# Patient Record
Sex: Female | Born: 1975 | Race: Black or African American | Hispanic: No | Marital: Married | Smoking: Never smoker
Health system: Southern US, Community
[De-identification: ages and names within clinical notes are randomized; demographics above are authoritative.]

## PROBLEM LIST (undated history)

## (undated) DIAGNOSIS — F419 Anxiety disorder, unspecified: Secondary | ICD-10-CM

## (undated) DIAGNOSIS — R42 Dizziness and giddiness: Secondary | ICD-10-CM

## (undated) DIAGNOSIS — M419 Scoliosis, unspecified: Secondary | ICD-10-CM

## (undated) DIAGNOSIS — F32A Depression, unspecified: Secondary | ICD-10-CM

## (undated) DIAGNOSIS — D649 Anemia, unspecified: Secondary | ICD-10-CM

## (undated) HISTORY — PX: TUBAL LIGATION: SHX77

## (undated) HISTORY — PX: DIAGNOSTIC LAPAROSCOPY: SUR761

---

## 2004-04-03 ENCOUNTER — Emergency Department (HOSPITAL_COMMUNITY): Admission: EM | Admit: 2004-04-03 | Discharge: 2004-04-03 | Payer: Self-pay | Admitting: Emergency Medicine

## 2004-05-26 ENCOUNTER — Emergency Department (HOSPITAL_COMMUNITY): Admission: EM | Admit: 2004-05-26 | Discharge: 2004-05-26 | Payer: Self-pay | Admitting: Emergency Medicine

## 2004-07-03 ENCOUNTER — Ambulatory Visit: Payer: Self-pay | Admitting: Internal Medicine

## 2004-07-03 ENCOUNTER — Ambulatory Visit: Payer: Self-pay | Admitting: *Deleted

## 2004-07-04 ENCOUNTER — Ambulatory Visit: Payer: Self-pay | Admitting: Internal Medicine

## 2004-08-31 ENCOUNTER — Ambulatory Visit: Payer: Self-pay | Admitting: Internal Medicine

## 2004-09-25 ENCOUNTER — Ambulatory Visit: Payer: Self-pay | Admitting: Family Medicine

## 2004-10-04 ENCOUNTER — Ambulatory Visit: Payer: Self-pay | Admitting: Internal Medicine

## 2004-11-21 ENCOUNTER — Ambulatory Visit: Payer: Self-pay | Admitting: Internal Medicine

## 2004-12-18 ENCOUNTER — Ambulatory Visit: Payer: Self-pay | Admitting: Family Medicine

## 2005-01-17 ENCOUNTER — Emergency Department (HOSPITAL_COMMUNITY): Admission: EM | Admit: 2005-01-17 | Discharge: 2005-01-17 | Payer: Self-pay | Admitting: Emergency Medicine

## 2005-03-12 ENCOUNTER — Ambulatory Visit: Payer: Self-pay | Admitting: Internal Medicine

## 2005-06-04 ENCOUNTER — Ambulatory Visit: Payer: Self-pay | Admitting: Internal Medicine

## 2005-06-05 ENCOUNTER — Ambulatory Visit: Payer: Self-pay | Admitting: Internal Medicine

## 2005-08-27 ENCOUNTER — Ambulatory Visit: Payer: Self-pay | Admitting: Internal Medicine

## 2005-11-19 ENCOUNTER — Ambulatory Visit: Payer: Self-pay | Admitting: Internal Medicine

## 2006-02-12 ENCOUNTER — Ambulatory Visit: Payer: Self-pay | Admitting: Internal Medicine

## 2006-03-26 ENCOUNTER — Ambulatory Visit: Payer: Self-pay | Admitting: Internal Medicine

## 2006-05-07 ENCOUNTER — Ambulatory Visit: Payer: Self-pay | Admitting: Internal Medicine

## 2006-07-29 ENCOUNTER — Ambulatory Visit: Payer: Self-pay | Admitting: Internal Medicine

## 2006-08-27 ENCOUNTER — Emergency Department (HOSPITAL_COMMUNITY): Admission: EM | Admit: 2006-08-27 | Discharge: 2006-08-27 | Payer: Self-pay | Admitting: Emergency Medicine

## 2006-10-16 ENCOUNTER — Encounter (INDEPENDENT_AMBULATORY_CARE_PROVIDER_SITE_OTHER): Payer: Self-pay | Admitting: *Deleted

## 2006-10-22 ENCOUNTER — Ambulatory Visit: Payer: Self-pay | Admitting: Family Medicine

## 2006-11-27 DIAGNOSIS — G43909 Migraine, unspecified, not intractable, without status migrainosus: Secondary | ICD-10-CM | POA: Insufficient documentation

## 2006-11-27 DIAGNOSIS — R05 Cough: Secondary | ICD-10-CM

## 2006-12-13 ENCOUNTER — Encounter (INDEPENDENT_AMBULATORY_CARE_PROVIDER_SITE_OTHER): Payer: Self-pay | Admitting: Internal Medicine

## 2006-12-30 ENCOUNTER — Emergency Department (HOSPITAL_COMMUNITY): Admission: EM | Admit: 2006-12-30 | Discharge: 2006-12-30 | Payer: Self-pay | Admitting: Emergency Medicine

## 2006-12-31 ENCOUNTER — Ambulatory Visit: Payer: Self-pay | Admitting: Internal Medicine

## 2007-01-01 ENCOUNTER — Ambulatory Visit: Payer: Self-pay | Admitting: Internal Medicine

## 2007-01-10 ENCOUNTER — Ambulatory Visit: Payer: Self-pay | Admitting: Internal Medicine

## 2007-01-13 ENCOUNTER — Ambulatory Visit: Payer: Self-pay | Admitting: Internal Medicine

## 2007-04-08 ENCOUNTER — Ambulatory Visit: Payer: Self-pay | Admitting: Internal Medicine

## 2007-06-30 ENCOUNTER — Ambulatory Visit: Payer: Self-pay | Admitting: Internal Medicine

## 2007-09-23 ENCOUNTER — Ambulatory Visit: Payer: Self-pay | Admitting: Internal Medicine

## 2007-12-17 ENCOUNTER — Ambulatory Visit: Payer: Self-pay | Admitting: Internal Medicine

## 2008-03-11 ENCOUNTER — Ambulatory Visit: Payer: Self-pay | Admitting: Internal Medicine

## 2008-06-15 ENCOUNTER — Ambulatory Visit: Payer: Self-pay | Admitting: Internal Medicine

## 2008-06-16 ENCOUNTER — Ambulatory Visit: Payer: Self-pay | Admitting: Internal Medicine

## 2008-08-19 ENCOUNTER — Emergency Department (HOSPITAL_COMMUNITY): Admission: EM | Admit: 2008-08-19 | Discharge: 2008-08-19 | Payer: Self-pay | Admitting: Emergency Medicine

## 2008-09-13 ENCOUNTER — Ambulatory Visit: Payer: Self-pay | Admitting: Internal Medicine

## 2008-10-07 ENCOUNTER — Encounter: Payer: Self-pay | Admitting: Internal Medicine

## 2008-10-07 ENCOUNTER — Ambulatory Visit: Payer: Self-pay | Admitting: Internal Medicine

## 2008-10-07 LAB — CONVERTED CEMR LAB
Chlamydia, DNA Probe: NEGATIVE
GC Probe Amp, Genital: NEGATIVE

## 2008-12-15 ENCOUNTER — Ambulatory Visit: Payer: Self-pay | Admitting: Internal Medicine

## 2008-12-15 LAB — CONVERTED CEMR LAB: Preg, Serum: NEGATIVE

## 2008-12-16 ENCOUNTER — Ambulatory Visit: Payer: Self-pay | Admitting: Internal Medicine

## 2009-03-08 ENCOUNTER — Ambulatory Visit: Payer: Self-pay | Admitting: Internal Medicine

## 2009-04-13 ENCOUNTER — Emergency Department (HOSPITAL_COMMUNITY): Admission: EM | Admit: 2009-04-13 | Discharge: 2009-04-13 | Payer: Self-pay | Admitting: Family Medicine

## 2009-06-07 ENCOUNTER — Ambulatory Visit (HOSPITAL_COMMUNITY): Admission: RE | Admit: 2009-06-07 | Discharge: 2009-06-07 | Payer: Self-pay | Admitting: Internal Medicine

## 2009-06-07 ENCOUNTER — Ambulatory Visit: Payer: Self-pay | Admitting: Internal Medicine

## 2009-06-09 ENCOUNTER — Telehealth (INDEPENDENT_AMBULATORY_CARE_PROVIDER_SITE_OTHER): Payer: Self-pay | Admitting: *Deleted

## 2009-09-14 ENCOUNTER — Ambulatory Visit: Payer: Self-pay | Admitting: Internal Medicine

## 2009-09-14 LAB — CONVERTED CEMR LAB: Preg, Serum: NEGATIVE

## 2009-09-15 ENCOUNTER — Ambulatory Visit: Payer: Self-pay | Admitting: Internal Medicine

## 2010-01-04 ENCOUNTER — Encounter (INDEPENDENT_AMBULATORY_CARE_PROVIDER_SITE_OTHER): Payer: Self-pay | Admitting: Family Medicine

## 2010-01-04 LAB — CONVERTED CEMR LAB: hCG, Beta Chain, Quant, S: <2 milliintl units/mL

## 2010-02-28 NOTE — Progress Notes (Signed)
Summary: triage/draining nodule under right breast  Phone Note Call from Patient   Caller: Patient Reason for Call: Talk to Nurse Summary of Call: patient was seen on 06/07/09 by Dr. Reche Dixon..small nodule under right breast..and placed on antibiotics...patient called back today to report area draining...consulted with Dr. Reche Dixon...apply warm compresses four times a day and continue antibiotics...patient advised to call back with worsening s/s..or no improvement in a few days. Initial call taken by: Conchita Paris,  Jun 09, 2009 12:32 PM

## 2010-04-23 LAB — BASIC METABOLIC PANEL
BUN: 6 mg/dL (ref 6–23)
CO2: 24 mEq/L (ref 19–32)
Calcium: 8.8 mg/dL (ref 8.4–10.5)
Glucose, Bld: 97 mg/dL (ref 70–99)
Sodium: 138 mEq/L (ref 135–145)

## 2010-04-23 LAB — CBC
HCT: 34.6 % — ABNORMAL LOW (ref 36.0–46.0)
Hemoglobin: 11.6 g/dL — ABNORMAL LOW (ref 12.0–15.0)
MCV: 85.2 fL (ref 78.0–100.0)

## 2010-04-23 LAB — DIFFERENTIAL
Eosinophils Relative: 3 % (ref 0–5)
Lymphs Abs: 2.4 10*3/uL (ref 0.7–4.0)
Monocytes Absolute: 0.7 10*3/uL (ref 0.1–1.0)
Neutrophils Relative %: 66 % (ref 43–77)

## 2010-05-24 ENCOUNTER — Other Ambulatory Visit (HOSPITAL_COMMUNITY): Payer: Self-pay | Admitting: Family Medicine

## 2010-05-24 DIAGNOSIS — R102 Pelvic and perineal pain: Secondary | ICD-10-CM

## 2010-05-29 ENCOUNTER — Ambulatory Visit (HOSPITAL_COMMUNITY)
Admission: RE | Admit: 2010-05-29 | Discharge: 2010-05-29 | Disposition: A | Discharge status: Home / Self Care | Payer: Self-pay | Source: Ambulatory Visit | Attending: Family Medicine | Admitting: Family Medicine

## 2010-05-29 ENCOUNTER — Other Ambulatory Visit (HOSPITAL_COMMUNITY): Payer: Self-pay | Admitting: Family Medicine

## 2010-05-29 DIAGNOSIS — R102 Pelvic and perineal pain: Secondary | ICD-10-CM

## 2010-05-29 DIAGNOSIS — R109 Unspecified abdominal pain: Secondary | ICD-10-CM | POA: Insufficient documentation

## 2010-05-29 DIAGNOSIS — N809 Endometriosis, unspecified: Secondary | ICD-10-CM | POA: Insufficient documentation

## 2010-05-29 DIAGNOSIS — D259 Leiomyoma of uterus, unspecified: Secondary | ICD-10-CM | POA: Insufficient documentation

## 2013-10-15 ENCOUNTER — Ambulatory Visit (HOSPITAL_COMMUNITY)
Admission: RE | Admit: 2013-10-15 | Discharge: 2013-10-15 | Disposition: A | Discharge status: Home / Self Care | Payer: Disability Insurance | Source: Ambulatory Visit | Attending: Family Medicine | Admitting: Family Medicine

## 2013-10-15 ENCOUNTER — Other Ambulatory Visit (HOSPITAL_COMMUNITY): Payer: Self-pay | Admitting: Family Medicine

## 2013-10-15 DIAGNOSIS — M545 Low back pain, unspecified: Secondary | ICD-10-CM | POA: Diagnosis not present

## 2013-10-15 DIAGNOSIS — M25551 Pain in right hip: Secondary | ICD-10-CM

## 2013-10-15 DIAGNOSIS — M25559 Pain in unspecified hip: Secondary | ICD-10-CM | POA: Insufficient documentation

## 2013-10-15 DIAGNOSIS — M4120 Other idiopathic scoliosis, site unspecified: Secondary | ICD-10-CM | POA: Diagnosis not present

## 2016-10-05 ENCOUNTER — Ambulatory Visit: Payer: Disability Insurance | Admitting: Family Medicine

## 2018-07-24 ENCOUNTER — Other Ambulatory Visit: Payer: Self-pay

## 2018-07-24 ENCOUNTER — Other Ambulatory Visit: Payer: Disability Insurance

## 2018-07-24 DIAGNOSIS — Z20822 Contact with and (suspected) exposure to covid-19: Secondary | ICD-10-CM

## 2018-07-28 LAB — NOVEL CORONAVIRUS, NAA: SARS-CoV-2, NAA: NOT DETECTED

## 2019-01-20 ENCOUNTER — Other Ambulatory Visit: Payer: Self-pay

## 2019-01-20 ENCOUNTER — Ambulatory Visit: Payer: Disability Insurance | Attending: Internal Medicine

## 2019-01-20 DIAGNOSIS — Z20822 Contact with and (suspected) exposure to covid-19: Secondary | ICD-10-CM

## 2019-01-22 LAB — NOVEL CORONAVIRUS, NAA: SARS-CoV-2, NAA: NOT DETECTED

## 2019-02-26 ENCOUNTER — Encounter (HOSPITAL_COMMUNITY): Payer: Self-pay | Admitting: *Deleted

## 2019-02-26 ENCOUNTER — Other Ambulatory Visit: Payer: Self-pay

## 2019-02-26 ENCOUNTER — Emergency Department (HOSPITAL_COMMUNITY): Payer: No Typology Code available for payment source

## 2019-02-26 ENCOUNTER — Emergency Department (HOSPITAL_COMMUNITY)
Admission: EM | Admit: 2019-02-26 | Discharge: 2019-02-26 | Disposition: A | Discharge status: Home / Self Care | Payer: No Typology Code available for payment source | Attending: Emergency Medicine | Admitting: Emergency Medicine

## 2019-02-26 DIAGNOSIS — M542 Cervicalgia: Secondary | ICD-10-CM | POA: Diagnosis not present

## 2019-02-26 DIAGNOSIS — Y92410 Unspecified street and highway as the place of occurrence of the external cause: Secondary | ICD-10-CM | POA: Insufficient documentation

## 2019-02-26 DIAGNOSIS — R519 Headache, unspecified: Secondary | ICD-10-CM | POA: Insufficient documentation

## 2019-02-26 DIAGNOSIS — Y999 Unspecified external cause status: Secondary | ICD-10-CM | POA: Insufficient documentation

## 2019-02-26 DIAGNOSIS — Y93I9 Activity, other involving external motion: Secondary | ICD-10-CM | POA: Diagnosis not present

## 2019-02-26 HISTORY — DX: Scoliosis, unspecified: M41.9

## 2019-02-26 HISTORY — DX: Dizziness and giddiness: R42

## 2019-02-26 LAB — POC URINE PREG, ED: Preg Test, Ur: NEGATIVE
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
## 2019-02-26 NOTE — Discharge Instructions (Addendum)
Follow-up with Dr. Aline Brochure next week.  Take Motrin or Tylenol for pain

## 2019-03-02 ENCOUNTER — Telehealth: Payer: Self-pay | Admitting: Orthopedic Surgery

## 2019-03-02 NOTE — Telephone Encounter (Signed)
Dr Aline Brochure was on call for ED so we will see her, ask her to pay something if she can, if not it is ok, we will bill her.
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
## 2019-03-02 NOTE — Telephone Encounter (Signed)
Called back to patient; scheduled appointment; aware. 

## 2019-03-10 ENCOUNTER — Ambulatory Visit: Payer: Self-pay | Admitting: Orthopaedic Surgery

## 2019-04-01 ENCOUNTER — Ambulatory Visit: Payer: Self-pay | Admitting: Orthopedic Surgery

## 2019-05-14 ENCOUNTER — Ambulatory Visit: Payer: Self-pay | Attending: Internal Medicine

## 2019-05-14 DIAGNOSIS — Z23 Encounter for immunization: Secondary | ICD-10-CM

## 2019-05-14 NOTE — Progress Notes (Signed)
   Covid-19 Vaccination Clinic  Name:  Janila Sylla    MRN: [id number] DOB: 11-25-1975  05/14/2019  Ms. Dimperio was observed post Covid-19 immunization for 15 minutes without incident. She was provided with Vaccine Information Sheet and instruction to access the V-Safe system.   Ms. Liggins was instructed to call 911 with any severe reactions post vaccine: Marland Kitchen Difficulty breathing  . Swelling of face and throat  . A fast heartbeat  . A bad rash all over body  . Dizziness and weakness   Immunizations Administered    Name Date Dose VIS Date Route   Pfizer COVID-19 Vaccine 05/14/2019 10:28 AM 0.3 mL 01/09/2019 Intramuscular   Manufacturer: [place]   Lot: [id number]   [place]: [id number]

## 2019-06-08 ENCOUNTER — Ambulatory Visit: Payer: Self-pay | Attending: Internal Medicine

## 2019-06-08 DIAGNOSIS — Z23 Encounter for immunization: Secondary | ICD-10-CM

## 2019-06-08 NOTE — Progress Notes (Signed)
   Covid-19 Vaccination Clinic  Name:  Yachiyo Catanzaro    MRN: [id number] DOB: [birth year]  06/08/2019  Ms. Youngers was observed post Covid-19 immunization for 15 minutes without incident. She was provided with Vaccine Information Sheet and instruction to access the V-Safe system.   Ms. Broaden was instructed to call 911 with any severe reactions post vaccine: Marland Kitchen Difficulty breathing  . Swelling of face and throat  . A fast heartbeat  . A bad rash all over body  . Dizziness and weakness   Immunizations Administered    Name Date Dose VIS Date Route   Pfizer COVID-19 Vaccine 06/08/2019 10:29 AM 0.3 mL 03/25/2018 Intramuscular   Manufacturer: [place]   Lot: [id number]   [place]: [id number]

## 2021-11-02 ENCOUNTER — Other Ambulatory Visit: Payer: Self-pay

## 2021-11-02 ENCOUNTER — Encounter (HOSPITAL_COMMUNITY): Payer: Self-pay

## 2021-11-02 ENCOUNTER — Emergency Department (HOSPITAL_COMMUNITY)
Admission: EM | Admit: 2021-11-02 | Discharge: 2021-11-02 | Disposition: A | Discharge status: Home / Self Care | Payer: Self-pay | Attending: Emergency Medicine | Admitting: Emergency Medicine

## 2021-11-02 DIAGNOSIS — U071 COVID-19: Secondary | ICD-10-CM | POA: Insufficient documentation

## 2021-11-02 LAB — RESP PANEL BY RT-PCR (FLU A&B, COVID) ARPGX2
Influenza A by PCR: NEGATIVE
Influenza B by PCR: NEGATIVE
SARS Coronavirus 2 by RT PCR: POSITIVE — AB

## 2021-11-02 NOTE — ED Notes (Signed)
Pt sitting in chair, pt states that she is ready to go home, pt verbalized understanding d/c and follow up, pt ambulatory from dpt with sig other.

## 2021-11-02 NOTE — ED Triage Notes (Signed)
Pt c/o cough, runny nose, sore throat and flu s/s. 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

## 2021-11-02 NOTE — Discharge Instructions (Signed)
You tested positive for COVID.  You will need to quarantine for the next 5 days from the time your symptoms started.  You may return to work after the 5 days as long as you are fever free.  This will take some time for you to get better.  Please drink plenty of fluids and get plenty of rest. Return to the ED for worsening symptoms.

## 2021-11-02 NOTE — ED Provider Notes (Signed)
[facility] EMERGENCY DEPARTMENT Provider Note   CSN: [id number] Arrival date & time: 11/02/21  1427     History Chief Complaint  Patient presents with   Cough    Brittany Hood is a 46 y.o. female patient who presents to the emergency department today for further evaluation of cough, generalized malaise, fatigue, and nasal congestion for 3 days.  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  She denies any fever, chills.  She has been self-medicating at home with soup broth.  She denies any abdominal pain, nausea, vomiting, diarrhea.   Cough      Home Medications Prior to Admission medications   Not on File      Allergies    Penicillins    Review of Systems   Review of Systems  Respiratory:  Positive for cough.   All other systems reviewed and are negative.   Physical Exam Updated Vital Signs BP (!) 142/83 (BP Location: Right Arm)   Pulse 83   Temp 98.1 F (36.7 C) (Oral)   Resp 18   Ht '5\' 2"'$  (1.575 m)   Wt 89.8 kg   SpO2 96%   BMI 36.21 kg/m  Physical Exam Vitals and nursing note reviewed.  Constitutional:      General: She is not in acute distress.    Appearance: Normal appearance.  HENT:     Head: Normocephalic and atraumatic.  Eyes:     General:        Right eye: No discharge.        Left eye: No discharge.  Cardiovascular:     Comments: Regular rate and rhythm.  S1/S2 are distinct without any evidence of murmur, rubs, or gallops.  Radial pulses are 2+ bilaterally.  Dorsalis pedis pulses are 2+ bilaterally.  No evidence of pedal edema. Pulmonary:     Comments: Clear to auscultation bilaterally.  Normal effort.  No respiratory distress.  No evidence of wheezes, rales, or rhonchi heard throughout. Abdominal:     General: Abdomen is flat. Bowel sounds are normal. There is no distension.     Tenderness: There is no abdominal tenderness. There is no guarding or rebound.  Musculoskeletal:         General: Normal range of motion.     Cervical back: Neck supple.  Skin:    General: Skin is warm and dry.     Findings: No rash.  Neurological:     General: No focal deficit present.     Mental Status: She is alert.  Psychiatric:        Mood and Affect: Mood normal.        Behavior: Behavior normal.     ED Results / Procedures / Treatments   Labs (all labs ordered are listed, but only abnormal results are displayed) Labs Reviewed  RESP PANEL BY RT-PCR (FLU A&B, COVID) [id number]    EKG None  Radiology No results found.  Procedures Procedures    Medications Ordered in ED Medications - No data to display  ED Course/ Medical Decision Making/ A&P Clinical Course as of 11/02/21 1824  Thu Nov 02, 2021  1822 I called down to the lab and they confirmed that the patient is COVID-positive. [CF]    Clinical Course User Index [CF] Hendricks Limes, [place]  Medical Decision Making Brittany Hood is a 46 y.o. female patient who presents to the emergency department today for further evaluation of cough, nasal congestion, and general malaise.  Patient is in no acute distress and resting comfortably in the ER.  Her lung sounds were clear in all fields.  I have a low suspicion for pneumonia at this time.  We will swab her for COVID and flu to further assess.  Patient tested positive for COVID.  This is likely the source of her symptoms.  I will have her quarantine for the next 5 days.  Patient is afebrile here and again I have a low suspicion for pneumonia at this time.  She is safe for discharge.  Strict return precautions were discussed.    Final Clinical Impression(s) / ED Diagnoses Final diagnoses:  COVID    Rx / DC Orders ED Discharge Orders     None         Hendricks Limes, [place] 11/02/21 Youlanda Mighty, MD 11/07/21 1050
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
## 2022-03-29 ENCOUNTER — Encounter: Payer: Self-pay | Admitting: Radiology

## 2022-06-14 ENCOUNTER — Emergency Department (HOSPITAL_COMMUNITY)
Admission: EM | Admit: 2022-06-14 | Discharge: 2022-06-14 | Disposition: A | Discharge status: Home / Self Care | Payer: Self-pay | Attending: Emergency Medicine | Admitting: Emergency Medicine

## 2022-06-14 ENCOUNTER — Encounter (HOSPITAL_COMMUNITY): Payer: Self-pay | Admitting: *Deleted

## 2022-06-14 ENCOUNTER — Other Ambulatory Visit: Payer: Self-pay | Admitting: Emergency Medicine

## 2022-06-14 ENCOUNTER — Other Ambulatory Visit: Payer: Self-pay

## 2022-06-14 DIAGNOSIS — N939 Abnormal uterine and vaginal bleeding, unspecified: Secondary | ICD-10-CM | POA: Insufficient documentation

## 2022-06-14 LAB — URINALYSIS, ROUTINE W REFLEX MICROSCOPIC
Glucose, UA: NEGATIVE mg/dL
Ketones, ur: NEGATIVE mg/dL
Nitrite: POSITIVE — AB
Protein, ur: 100 mg/dL — AB
Specific Gravity, Urine: >1.03 — ABNORMAL HIGH (ref 1.005–1.030)
pH: 5.5 (ref 5.0–8.0)

## 2022-06-14 LAB — COMPREHENSIVE METABOLIC PANEL
ALT: 11 U/L (ref 0–44)
AST: 9 U/L — ABNORMAL LOW (ref 15–41)
Albumin: 4 g/dL (ref 3.5–5.0)
Alkaline Phosphatase: 69 U/L (ref 38–126)
Anion gap: 9 (ref 5–15)
BUN: 10 mg/dL (ref 6–20)
CO2: 23 mmol/L (ref 22–32)
Calcium: 8.3 mg/dL — ABNORMAL LOW (ref 8.9–10.3)
Chloride: 105 mmol/L (ref 98–111)
Creatinine, Ser: 0.78 mg/dL (ref 0.44–1.00)
GFR, Estimated: >60 mL/min (ref >60)
Glucose, Bld: 105 mg/dL — ABNORMAL HIGH (ref 70–99)
Potassium: 3.5 mmol/L (ref 3.5–5.1)
Sodium: 137 mmol/L (ref 135–145)
Total Bilirubin: 0.8 mg/dL (ref 0.3–1.2)
Total Protein: 7.8 g/dL (ref 6.5–8.1)

## 2022-06-14 LAB — CBC
HCT: 31.6 % — ABNORMAL LOW (ref 36.0–46.0)
Hemoglobin: 9.8 g/dL — ABNORMAL LOW (ref 12.0–15.0)
MCH: 27.8 pg (ref 26.0–34.0)
MCHC: 31 g/dL (ref 30.0–36.0)
MCV: 89.5 fL (ref 80.0–100.0)
Platelets: 430 10*3/uL — ABNORMAL HIGH (ref 150–400)
RBC: 3.53 MIL/uL — ABNORMAL LOW (ref 3.87–5.11)
RDW: 13.2 % (ref 11.5–15.5)
WBC: 7.8 10*3/uL (ref 4.0–10.5)
nRBC: 0 % (ref 0.0–0.2)

## 2022-06-14 LAB — URINALYSIS, MICROSCOPIC (REFLEX): RBC / HPF: >50 RBC/hpf (ref 0–5)

## 2022-06-14 LAB — POC URINE PREG, ED: Preg Test, Ur: NEGATIVE

## 2022-06-14 MED ORDER — MEDROXYPROGESTERONE ACETATE 5 MG PO TABS: 5.0000 mg | ORAL_TABLET | Freq: Every day | ORAL | 0 refills | Status: DC

## 2022-06-14 NOTE — ED Triage Notes (Signed)
Pt reports heavy vaginal bleeding that has been happening off and on since February.  Pt states she is passing clots and having abdominal cramping.  Pt reports she doesn't have a GYN

## 2022-06-14 NOTE — ED Provider Notes (Signed)
[place] EMERGENCY DEPARTMENT AT [facility] Provider Note   CSN: [id number] Arrival date & time: 06/14/22  1334     History  Chief Complaint  Patient presents with   Vaginal Bleeding    Brittany Hood is a 47 y.o. female.   Vaginal Bleeding    This patient is a 47 year old female, she has no significant prior medical history, she does not take any daily medications, she has had a history of fairly heavy vaginal bleeding over the last few months which she states is pretty much every day, she was very regular until a couple of years ago when she started having infrequent periods and now is having very chaotic and random bleeding.  She has not seen a gynecologist, she does not have a doctor, she does not take any over-the-counter medications.  She denies being lightheaded or short of breath or generally weak  Home Medications Prior to Admission medications   Medication Sig Start Date End Date Taking? Authorizing Provider  medroxyPROGESTERone (PROVERA) 5 MG tablet Take 1 tablet (5 mg total) by mouth daily for 10 days. 06/14/22 06/24/22 Yes Eber Hong, MD      Allergies    Penicillins    Review of Systems   Review of Systems  Genitourinary:  Positive for vaginal bleeding.  All other systems reviewed and are negative.   Physical Exam Updated Vital Signs BP 122/78 (BP Location: Right Arm)   Pulse 92   Temp 98.3 F (36.8 C) (Oral)   Resp 20   Ht 1.575 m (5\' 2" )   Wt 84.4 kg   SpO2 100%   BMI 34.02 kg/m  Physical Exam Vitals and nursing note reviewed.  Constitutional:      General: She is not in acute distress.    Appearance: She is well-developed.  HENT:     Head: Normocephalic and atraumatic.     Mouth/Throat:     Pharynx: No oropharyngeal exudate.  Eyes:     General: No scleral icterus.       Right eye: No discharge.        Left eye: No discharge.     Conjunctiva/sclera: Conjunctivae normal.     Pupils: Pupils are equal, round, and reactive  to light.  Neck:     Thyroid: No thyromegaly.     Vascular: No JVD.  Cardiovascular:     Rate and Rhythm: Normal rate and regular rhythm.     Heart sounds: Normal heart sounds. No murmur heard.    No friction rub. No gallop.  Pulmonary:     Effort: Pulmonary effort is normal. No respiratory distress.     Breath sounds: Normal breath sounds. No wheezing or rales.  Abdominal:     General: Bowel sounds are normal. There is no distension.     Palpations: Abdomen is soft. There is no mass.     Tenderness: There is abdominal tenderness.     Comments: Minimal lower abdominal at the suprapubic region tenderness to palpation without any guarding or pulsating mass  Genitourinary:    Comments: Deferred Musculoskeletal:        General: No tenderness. Normal range of motion.     Cervical back: Normal range of motion and neck supple.  Lymphadenopathy:     Cervical: No cervical adenopathy.  Skin:    General: Skin is warm and dry.     Findings: No erythema or rash.  Neurological:     Mental Status: She is alert.  Coordination: Coordination normal.  Psychiatric:        Behavior: Behavior normal.     ED Results / Procedures / Treatments   Labs (all labs ordered are listed, but only abnormal results are displayed) Labs Reviewed  COMPREHENSIVE METABOLIC PANEL - Abnormal; Notable for the following components:      Result Value   Glucose, Bld 105 (*)    Calcium 8.3 (*)    AST 9 (*)    All other components within normal limits  CBC - Abnormal; Notable for the following components:   RBC 3.53 (*)    Hemoglobin 9.8 (*)    HCT 31.6 (*)    Platelets 430 (*)    All other components within normal limits  URINALYSIS, ROUTINE W REFLEX MICROSCOPIC  POC URINE PREG, ED    EKG None  Radiology No results found.  Procedures Procedures    Medications Ordered in ED Medications - No data to display  ED Course/ Medical Decision Making/ A&P                             Medical Decision  Making Amount and/or Complexity of Data Reviewed Labs: ordered.    This patient presents to the ED for concern of vaginal bleeding, menometrorrhagia differential diagnosis includes abnormal hormones, menopause, pregnancy, miscarriage, coagulopathy    Additional history obtained:  Additional history obtained from medical record External records from outside source obtained and reviewed including prior labs from prior hospitalizations, hemoglobin is down approximately 1-1/2 g to 2 g since that time   Lab Tests:  I Ordered, and personally interpreted labs.  The pertinent results include: Hemoglobin of 9.8, normal platelets, not pregnant    Medicines ordered and prescription drug management:  I ordered medication including Provera  for vaginal bleeding I have reviewed the patients home medicines and have made adjustments as needed   Problem List / ED Course:  No bleeding, stable   Social Determinants of Health:  I have discussed with the patient at the bedside the results, and the meaning of these results.  They have expressed her understanding to the need for follow-up with primary care physician           Final Clinical Impression(s) / ED Diagnoses Final diagnoses:  Vaginal bleeding    Rx / DC Orders ED Discharge Orders          Ordered    medroxyPROGESTERone (PROVERA) 5 MG tablet  Daily        06/14/22 1503              Eber Hong, MD 06/14/22 1504

## 2022-06-14 NOTE — Discharge Instructions (Signed)
I suspect that the cause of your bleeding is secondary to the changes in your hormone levels.  At this time I would recommend that you take Provera 1 tablet a day for the next 10 days, this should gradually slow down your bleeding.  It may cause some cramping.  You must be seen by the gynecologist within that 10 days, ER for severe or worsening symptoms.  I gave you the phone number for the gynecologist above.

## 2022-06-27 ENCOUNTER — Encounter (HOSPITAL_COMMUNITY): Payer: Self-pay | Admitting: Emergency Medicine

## 2022-06-27 ENCOUNTER — Other Ambulatory Visit: Payer: Self-pay

## 2022-06-27 ENCOUNTER — Emergency Department (HOSPITAL_COMMUNITY): Payer: Self-pay

## 2022-06-27 ENCOUNTER — Emergency Department (HOSPITAL_COMMUNITY)
Admission: EM | Admit: 2022-06-27 | Discharge: 2022-06-28 | Disposition: A | Discharge status: Home / Self Care | Payer: Self-pay | Attending: Emergency Medicine | Admitting: Emergency Medicine

## 2022-06-27 DIAGNOSIS — N938 Other specified abnormal uterine and vaginal bleeding: Secondary | ICD-10-CM | POA: Insufficient documentation

## 2022-06-27 DIAGNOSIS — D649 Anemia, unspecified: Secondary | ICD-10-CM | POA: Insufficient documentation

## 2022-06-27 DIAGNOSIS — N939 Abnormal uterine and vaginal bleeding, unspecified: Secondary | ICD-10-CM

## 2022-06-27 LAB — CBC
HCT: 23.7 % — ABNORMAL LOW (ref 36.0–46.0)
Hemoglobin: 7.2 g/dL — ABNORMAL LOW (ref 12.0–15.0)
MCH: 27.3 pg (ref 26.0–34.0)
MCHC: 30.4 g/dL (ref 30.0–36.0)
MCV: 89.8 fL (ref 80.0–100.0)
Platelets: 422 10*3/uL — ABNORMAL HIGH (ref 150–400)
RBC: 2.64 MIL/uL — ABNORMAL LOW (ref 3.87–5.11)
RDW: 13.4 % (ref 11.5–15.5)
WBC: 7.5 10*3/uL (ref 4.0–10.5)
nRBC: 0 % (ref 0.0–0.2)

## 2022-06-27 LAB — BASIC METABOLIC PANEL
Anion gap: 8 (ref 5–15)
BUN: 9 mg/dL (ref 6–20)
CO2: 21 mmol/L — ABNORMAL LOW (ref 22–32)
Calcium: 8.2 mg/dL — ABNORMAL LOW (ref 8.9–10.3)
Chloride: 108 mmol/L (ref 98–111)
Creatinine, Ser: 0.67 mg/dL (ref 0.44–1.00)
GFR, Estimated: >60 mL/min (ref >60)
Glucose, Bld: 129 mg/dL — ABNORMAL HIGH (ref 70–99)
Potassium: 3.6 mmol/L (ref 3.5–5.1)
Sodium: 137 mmol/L (ref 135–145)

## 2022-06-27 LAB — TYPE AND SCREEN
Donor AG Type: NEGATIVE
Unit division: 0

## 2022-06-27 LAB — PREPARE RBC (CROSSMATCH)

## 2022-06-27 LAB — ABO/RH: ABO/RH(D): O POS

## 2022-06-27 LAB — BPAM RBC: Blood Product Expiration Date: 202406272359

## 2022-06-27 MED ORDER — ONDANSETRON HCL 4 MG/2ML IJ SOLN
4.0000 mg | Freq: Once | INTRAMUSCULAR | Status: AC
  Administered 2022-06-27: 4 mg via INTRAVENOUS
  Filled 2022-06-27: qty 2

## 2022-06-27 MED ORDER — SODIUM CHLORIDE 0.9% IV SOLUTION: Freq: Once | INTRAVENOUS | Status: DC

## 2022-06-27 MED ORDER — TRANEXAMIC ACID-NACL 1000-0.7 MG/100ML-% IV SOLN
1000.0000 mg | Freq: Once | INTRAVENOUS | Status: AC
  Administered 2022-06-27: 1000 mg via INTRAVENOUS
  Filled 2022-06-27: qty 100

## 2022-06-27 MED ORDER — KETOROLAC TROMETHAMINE 30 MG/ML IJ SOLN
15.0000 mg | Freq: Once | INTRAMUSCULAR | Status: AC
  Administered 2022-06-27: 15 mg via INTRAVENOUS
  Filled 2022-06-27: qty 1

## 2022-06-27 NOTE — ED Provider Notes (Incomplete)
[place] EMERGENC[facility] DEPARTMENT AT [facility] Provider Note   CSN: [id number] Arrival date & time: 06/27/22  1256     History {Add pertinent medical, surgical, social history, OB history to HPI:1} Chief Complaint  Patient presents with  . Vaginal Bleeding    Brittany Hood is a 47 y.o. 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 She was seen with similar complaint on May 16 here at which time she was started on a course of Provera which she completed but did not improve her symptoms.  She returns today secondary to worsening bleeding but also secondary to generalized weakness and lightheadedness with standing.  After her last visit here she has established care with the health department but they cannot see her until mid June.  She denies fevers or chills, denies dysuria, no changes in bowels, she does report nausea and low pelvic cramping.  She is found no alleviators for symptoms.  Prior to this change in February she reports very regular periods without spotting.  The history is provided by the patient.       Home Medications Prior to Admission medications   Medication Sig Start Date End Date Taking? Authorizing Provider  naproxen sodium (ALEVE) 220 MG tablet Take 440 mg by mouth daily as needed (pain).   Yes [provider]  medroxyPROGESTERone (PROVERA) 5 MG tablet Take 1 tablet (5 mg total) by mouth daily for 10 days. Patient not taking: Reported on 06/27/2022 06/14/22 06/24/22  Eber Hong, MD      Allergies    Penicillins    Review of Systems   Review of Systems  Constitutional:  Negative for chills and fever.  HENT:  Negative for congestion and sore throat.   Eyes: Negative.   Respiratory:  Negative for chest tightness and shortness of  breath.   Cardiovascular:  Negative for chest pain.  Gastrointestinal:  Positive for nausea. Negative for abdominal pain.  Genitourinary:  Positive for pelvic pain and vaginal bleeding.  Musculoskeletal:  Negative for arthralgias, joint swelling and neck pain.  Skin: Negative.  Negative for rash and wound.  Neurological:  Positive for light-headedness. Negative for dizziness, weakness, numbness and headaches.  Psychiatric/Behavioral: Negative.    All other systems reviewed and are negative.   Physical Exam Updated Vital Signs BP 123/72   Pulse 99   Temp 98.5 F (36.9 C) (Oral)   Resp 18   Ht 5\' 2"  (1.575 m)   Wt 84.4 kg   LMP 03/01/2022 (Approximate)   SpO2 100%   BMI 34.02 kg/m  Physical Exam Vitals and nursing note reviewed. Exam conducted with a chaperone present.  Constitutional:      Appearance: She is well-developed.  HENT:     Head: Normocephalic and atraumatic.  Eyes:     Conjunctiva/sclera: Conjunctivae normal.  Cardiovascular:     Rate and Rhythm: Normal rate and regular rhythm.     Heart sounds: Normal heart sounds.  Pulmonary:     Effort: Pulmonary effort is normal.     Breath sounds: Normal breath sounds. No wheezing.  Abdominal:     General: Bowel sounds are normal.     Palpations: Abdomen is soft.     Tenderness: There is abdominal tenderness in the suprapubic area. There is no guarding.  Genitourinary:  Cervix: Cervical bleeding present.     Uterus: Tender.      Adnexa:        Right: No mass or tenderness.         Left: No mass or tenderness.       Comments: Medium clot and liquid blood in vagina Musculoskeletal:        General: Normal range of motion.     Cervical back: Normal range of motion.  Skin:    General: Skin is warm and dry.  Neurological:     Mental Status: She is alert.     ED Results / Procedures / Treatments   Labs (all labs ordered are listed, but only abnormal results are displayed) Labs Reviewed  CBC - Abnormal; Notable  for the following components:      Result Value   RBC 2.64 (*)    Hemoglobin 7.2 (*)    HCT 23.7 (*)    Platelets 422 (*)    All other components within normal limits  BASIC METABOLIC PANEL  POC URINE PREG, ED  TYPE AND SCREEN  ABO/RH  PREPARE RBC (CROSSMATCH)    EKG None  Radiology US PELVIS (TRANSABDOMINAL ONLY)  Result Date: 06/27/2022 CLINICAL DATA:  Pelvic pain and vaginal bleeding EXAM: TRANSABDOMINAL ULTRASOUND OF PELVIS TECHNIQUE: Transabdominal ultrasound examination of the pelvis was performed including evaluation of the uterus, ovaries, adnexal regions, and pelvic cul-de-sac. COMPARISON:  Ultrasound pelvis dated 05/29/2010 FINDINGS: Uterus Measurements: 10.6 cm in sagittal dimension. Heterogeneously hypoechoic focus along the right lower uterine body measures 4.8 x 4.4 x 4.1 cm. Endometrium Thickness: 12 mm.  No focal abnormality visualized. Right ovary Not seen. Left ovary Measurements: 5.6 x 5.4 x 2.7 cm = volume: 41.9 mL. Contains a simple cyst measuring 5.2 x 4.0 x 3.1 cm. Other findings:  No abnormal free fluid. IMPRESSION: 1. Heterogeneously hypoechoic focus along the right lower uterine body measures 4.8 x 4.4 x 4.1 cm, possibly a fibroid, although a right adnexal mass is within the differential. Consider nonemergent contrast-enhanced MRI pelvis for further evaluation. 2. Simple left ovarian cyst measuring 5.2 x 4.0 x 3.1 cm. Recommend follow-up pelvic ultrasound examination in 3-6 weeks to ensure resolution. 3. Right ovary not seen. Electronically Signed   By: Agustin Cree M.D.   On: 06/27/2022 17:00    Procedures Procedures  {Document cardiac monitor, telemetry assessment procedure when appropriate:1}  Medications Ordered in ED Medications  0.9 %  sodium chloride infusion (Manually program via Guardrails IV Fluids) (has no administration in time range)  tranexamic acid (CYKLOKAPRON) IVPB 1,000 mg (0 mg Intravenous Stopped 06/27/22 1859)    ED Course/ Medical Decision  Making/ A&P   {   Click here for ABCD2, HEART and other calculatorsREFRESH Note before signing :1}                          Medical Decision Making Amount and/or Complexity of Data Reviewed Labs: ordered. Radiology: ordered.  Risk Prescription drug management. Decision regarding hospitalization.     {Document critical care time when appropriate:1} {Document review of labs and clinical decision tools ie heart score, Chads2Vasc2 etc:1}  {Document your independent review of radiology images, and any outside records:1} {Document your discussion with family members, caretakers, and with consultants:1} {Document social determinants of health affecting pt's care:1} {Document your decision making why or why not admission, treatments were needed:1} Final Clinical Impression(s) / ED Diagnoses Final diagnoses:  None    Rx /  DC Orders ED Discharge Orders     None

## 2022-06-27 NOTE — Progress Notes (Signed)
Patient has been having significant vaginal bleeding for 2 months and within the last 1 week, she started to have worsening significant bleeding when she had to change tampons every 1/2 hour in which case she changed to period underwear and was using 2 boxes a week, she presents with dizziness and nausea with pelvic pain. She was seen 2 weeks ago in the ED due to same vaginal bleeding and was prescribed with Provera 5 mg daily, unfortunately, patient continued to have some symptoms and returned to the ED today. On physical examination performed by ED PA, patient was actively bleeding at this time, hemoglobin has dropped 2 units (currently at 7.2).  Type and screen was done, but it was reported that patient has positive antibody screen and that the ordered blood may not get to [facility] until tomorrow morning.  Gynecologist consulted gave some recommendation with hope that patient's vaginal bleeding will stop after recommendation is carried out and me will be discharged home. Patient is still actively bleeding at this time per ED PA and was symptomatically anemic.  BMP is still pending.  Hospitalist was asked to admit patient at this time. Admission is currently deferred considering current active vaginal bleeding with symptomatic anemia.  ED PA was asked to give the tranexamic acid given some  time to act and to reach out to the Gynecologist if no improvement. However, she was asked to call back when all labs are available, patient's vaginal bleeding is better controlled, patient is more stable and there is still need for patient to be admitted.

## 2022-06-27 NOTE — ED Triage Notes (Signed)
Pt c/o vaginal bleeding since Feb with worsening of symptoms since recent eval in ER. Pt reports large clots and states she was having to change tampons every half hour so she switched to period underwear and is going through 2 boxes a week. Pt reports dizziness and nausea with pelvic pain.

## 2022-06-27 NOTE — ED Provider Notes (Signed)
[place] EMERGENCY DEPARTMENT AT [facility] Provider Note   CSN: [id number] Arrival date & time: 06/27/22  1256     History {Add pertinent medical, surgical, social history, OB history to HPI:1} Chief Complaint  Patient presents with  . Vaginal Bleeding    Brandilyn Bartosik is a 47 y.o. female   The history is provided by the patient.       Home Medications Prior to Admission medications   Medication Sig Start Date End Date Taking? Authorizing Provider  naproxen sodium (ALEVE) 220 MG tablet Take 440 mg by mouth daily as needed (pain).   Yes [provider]  medroxyPROGESTERone (PROVERA) 5 MG tablet Take 1 tablet (5 mg total) by mouth daily for 10 days. Patient not taking: Reported on 06/27/2022 06/14/22 06/24/22  Eber Hong, MD      Allergies    Penicillins    Review of Systems   Review of Systems  Physical Exam Updated Vital Signs BP 123/72   Pulse 99   Temp 98.5 F (36.9 C) (Oral)   Resp 18   Ht 5\' 2"  (1.575 m)   Wt 84.4 kg   LMP 03/01/2022 (Approximate)   SpO2 100%   BMI 34.02 kg/m  Physical Exam  ED Results / Procedures / Treatments   Labs (all labs ordered are listed, but only abnormal results are displayed) Labs Reviewed  CBC - Abnormal; Notable for the following components:      Result Value   RBC 2.64 (*)    Hemoglobin 7.2 (*)    HCT 23.7 (*)    Platelets 422 (*)    All other components within normal limits  BASIC METABOLIC PANEL  POC URINE PREG, ED  TYPE AND SCREEN  ABO/RH  PREPARE RBC (CROSSMATCH)    EKG None  Radiology US PELVIS (TRANSABDOMINAL ONLY)  Result Date: 06/27/2022 CLINICAL DATA:  Pelvic pain and vaginal bleeding EXAM: TRANSABDOMINAL ULTRASOUND OF PELVIS TECHNIQUE: Transabdominal ultrasound examination of the pelvis was performed including evaluation of the uterus, ovaries, adnexal regions, and pelvic cul-de-sac. COMPARISON:  Ultrasound pelvis dated 05/29/2010 FINDINGS: Uterus Measurements: 10.6 cm  in sagittal dimension. Heterogeneously hypoechoic focus along the right lower uterine body measures 4.8 x 4.4 x 4.1 cm. Endometrium Thickness: 12 mm.  No focal abnormality visualized. Right ovary Not seen. Left ovary Measurements: 5.6 x 5.4 x 2.7 cm = volume: 41.9 mL. Contains a simple cyst measuring 5.2 x 4.0 x 3.1 cm. Other findings:  No abnormal free fluid. IMPRESSION: 1. Heterogeneously hypoechoic focus along the right lower uterine body measures 4.8 x 4.4 x 4.1 cm, possibly a fibroid, although a right adnexal mass is within the differential. Consider nonemergent contrast-enhanced MRI pelvis for further evaluation. 2. Simple left ovarian cyst measuring 5.2 x 4.0 x 3.1 cm. Recommend follow-up pelvic ultrasound examination in 3-6 weeks to ensure resolution. 3. Right ovary not seen. Electronically Signed   By: Agustin Cree M.D.   On: 06/27/2022 17:00    Procedures Procedures  {Document cardiac monitor, telemetry assessment procedure when appropriate:1}  Medications Ordered in ED Medications  0.9 %  sodium chloride infusion (Manually program via Guardrails IV Fluids) (has no administration in time range)  tranexamic acid (CYKLOKAPRON) IVPB 1,000 mg (0 mg Intravenous Stopped 06/27/22 1859)    ED Course/ Medical Decision Making/ A&P   {   Click here for ABCD2, HEART and other calculatorsREFRESH Note before signing :1}  Medical Decision Making Amount and/or Complexity of Data Reviewed Labs: ordered. Radiology: ordered.  Risk Prescription drug management. Decision regarding hospitalization.   ***  {Document critical care time when appropriate:1} {Document review of labs and clinical decision tools ie heart score, Chads2Vasc2 etc:1}  {Document your independent review of radiology images, and any outside records:1} {Document your discussion with family members, caretakers, and with consultants:1} {Document social determinants of health affecting pt's care:1} {Document  your decision making why or why not admission, treatments were needed:1} Final Clinical Impression(s) / ED Diagnoses Final diagnoses:  None    Rx / DC Orders ED Discharge Orders     None

## 2022-06-27 NOTE — ED Notes (Signed)
Pt called out needing to use restroom. Put pt on the bedside.There was a small clot and some blood. Pt made comfortable as possible. Placed back on monitor. PA and RN notified

## 2022-06-27 NOTE — ED Notes (Signed)
Ok to eat per EDP

## 2022-06-28 LAB — HEMOGLOBIN AND HEMATOCRIT, BLOOD
HCT: 20.9 % — ABNORMAL LOW (ref 36.0–46.0)
Hemoglobin: 6.4 g/dL — CL (ref 12.0–15.0)

## 2022-06-28 LAB — TYPE AND SCREEN
ABO/RH(D): O POS
PT AG Type: NEGATIVE
Unit division: 0

## 2022-06-28 LAB — BPAM RBC: ISSUE DATE / TIME: 202405300115

## 2022-06-28 MED ORDER — MEGESTROL ACETATE 40 MG PO TABS: 80.0000 mg | ORAL_TABLET | Freq: Two times a day (BID) | ORAL | 1 refills | Status: DC

## 2022-06-28 NOTE — Discharge Instructions (Addendum)
You will need close follow-up with a gynecologist.  Please [facility] to get a close follow-up appointment with them.  In the interim stop taking your Provera, instead we are giving you Megace which should work better for controlling this bleeding.

## 2022-06-28 NOTE — Progress Notes (Addendum)
UNMATCHED BLOOD PRODUCT NOTE  Compare the patient ID on the blood tag to the patient ID on the hospital armband and Blood Bank armband. Then confirm the unit number on the blood tag matches the unit number on the blood product.  If a discrepancy is discovered return the product to blood bank immediately.   Blood Product Type: Packed Red Blood Cells  Unit #: (Found on blood product bag, begins with W)  [id number]  Product Code #: (Found on blood product bag, begins with E) [id number]   Start Time: 0341    Starting Rate: 120 ml/hr  Rate increase/decreased  (if applicable):    300  ml/hr  Rate changed time (if applicable):  0357   Stop Time:  0526  Verified with Orlena Sheldon RN   All Other Documentation should be documented within the Blood Admin Flowsheet per policy.

## 2022-06-29 LAB — TYPE AND SCREEN
Antibody Screen: POSITIVE
Donor AG Type: NEGATIVE

## 2022-06-29 LAB — BPAM RBC
Blood Product Expiration Date: 202406262359
Blood Product Expiration Date: 202406262359
ISSUE DATE / TIME: 202405300115
Unit Type and Rh: 5100
Unit Type and Rh: 5100

## 2022-07-03 ENCOUNTER — Other Ambulatory Visit: Payer: Self-pay | Admitting: Nurse Practitioner

## 2022-07-03 DIAGNOSIS — Z1231 Encounter for screening mammogram for malignant neoplasm of breast: Secondary | ICD-10-CM

## 2022-07-10 ENCOUNTER — Encounter: Payer: Self-pay | Admitting: Obstetrics & Gynecology

## 2022-07-23 ENCOUNTER — Encounter (HOSPITAL_COMMUNITY): Payer: Self-pay

## 2022-07-23 ENCOUNTER — Telehealth: Payer: Self-pay

## 2022-07-23 ENCOUNTER — Ambulatory Visit (HOSPITAL_COMMUNITY)
Admission: RE | Admit: 2022-07-23 | Discharge: 2022-07-23 | Disposition: A | Discharge status: Home / Self Care | Payer: Self-pay | Source: Ambulatory Visit | Attending: Nurse Practitioner | Admitting: Nurse Practitioner

## 2022-07-23 DIAGNOSIS — Z1231 Encounter for screening mammogram for malignant neoplasm of breast: Secondary | ICD-10-CM | POA: Insufficient documentation

## 2022-07-23 NOTE — Telephone Encounter (Signed)
I called and spoke to patient in regards to her missed appointment for ED f/u on 06/11 as well as the referral that was received from [facility] and patient states that she doesn't want to be scheduled at this time and will call back at a later date.

## 2022-07-30 ENCOUNTER — Telehealth: Payer: Self-pay

## 2022-07-30 NOTE — Telephone Encounter (Signed)
Telephoned patient at mobile number. No answer and mailbox full. [facility]

## 2022-07-31 ENCOUNTER — Other Ambulatory Visit (HOSPITAL_COMMUNITY): Payer: Self-pay | Admitting: Obstetrics and Gynecology

## 2022-07-31 DIAGNOSIS — R928 Other abnormal and inconclusive findings on diagnostic imaging of breast: Secondary | ICD-10-CM

## 2022-08-24 ENCOUNTER — Inpatient Hospital Stay: Payer: Self-pay | Attending: Obstetrics and Gynecology | Admitting: Hematology and Oncology

## 2022-08-24 VITALS — BP 137/89 | Wt 183.8 lb

## 2022-08-24 DIAGNOSIS — N6310 Unspecified lump in the right breast, unspecified quadrant: Secondary | ICD-10-CM

## 2022-08-24 NOTE — Patient Instructions (Signed)
Taught Brittany Hood about self breast awareness and gave educational materials to take home. Patient did not need a Pap smear today due to last Pap smear was in 07/05/2022 per patient. Let her know [facility] will cover Pap smears every 5 years unless has a history of abnormal Pap smears. Referred patient to the [facility] for diagnostic mammogram. Appointment scheduled for 08/28/22. Patient aware of appointment and will be there. Let patient know will follow up with her within the next couple weeks with results. Rosezella Barmes verbalized understanding.  Pascal Lux, NP 11:24 AM

## 2022-08-24 NOTE — Progress Notes (Signed)
Brittany Hood is a 47 y.o. female who presents to [facility] clinic today with complaint of possible right breast mass noted on screening mammogram.    Pap Smear: Pap not smear completed today. Last Pap smear was 07/05/2022 at [facility] clinic and was normal/HPV-. Per patient has no history of an abnormal Pap smear. Last Pap smear result is available in Epic.   Physical exam: Breasts Breasts symmetrical. No skin abnormalities bilateral breasts. No nipple retraction bilateral breasts. No nipple discharge bilateral breasts. No lymphadenopathy. No lumps palpated bilateral breasts.  MS 3D SCR MAMMO BILAT BR (aka MM)  Result Date: 07/25/2022 CLINICAL DATA:  Screening. EXAM: DIGITAL SCREENING BILATERAL MAMMOGRAM WITH TOMOSYNTHESIS AND CAD TECHNIQUE: Bilateral screening digital craniocaudal and mediolateral oblique mammograms were obtained. Bilateral screening digital breast tomosynthesis was performed. The images were evaluated with computer-aided detection. COMPARISON:  Previous exam(s). ACR Breast Density Category b: There are scattered areas of fibroglandular density. FINDINGS: In the right breast, a possible mass warrants further evaluation. In the left breast, no findings suspicious for malignancy. IMPRESSION: Further evaluation is suggested for a possible mass in the right breast. RECOMMENDATION: Diagnostic mammogram and possibly ultrasound of the right breast. (Code:[id number]) The patient will be contacted regarding the findings, and additional imaging will be scheduled. BI-RADS CATEGORY  0: Incomplete: Need additional imaging evaluation. Electronically Signed   By: Elberta Fortis M.D.   On: 07/25/2022 12:14         Pelvic/Bimanual Pap is not indicated today    Smoking History: Patient has never smoked and was not referred to quit line.    Patient Navigation: Patient education provided. Access to services provided for patient through [facility] program. No interpreter provided. No  transportation provided   Colorectal Cancer Screening: Per patient has never had colonoscopy completed No complaints today.    Breast and Cervical Cancer Risk Assessment: Patient does not have family history of breast cancer, known genetic mutations, or radiation treatment to the chest before age 22. Patient does not have history of cervical dysplasia, immunocompromised, or DES exposure in-utero.  Risk Assessment   No risk assessment data     A: [facility] exam without pap smear No complaints with benign exam. Follow up possible right breast mass noted on screening mammogram.   P: Referred patient to the [facility] for a diagnostic mammogram. Appointment scheduled 08/24/22.  Pascal Lux, NP 08/24/2022 11:06 AM

## 2022-08-28 ENCOUNTER — Ambulatory Visit (HOSPITAL_COMMUNITY): Payer: Self-pay

## 2022-08-28 ENCOUNTER — Inpatient Hospital Stay (HOSPITAL_COMMUNITY): Admission: RE | Admit: 2022-08-28 | Payer: Self-pay | Source: Ambulatory Visit

## 2022-09-04 ENCOUNTER — Ambulatory Visit (HOSPITAL_COMMUNITY)
Admission: RE | Admit: 2022-09-04 | Discharge: 2022-09-04 | Disposition: A | Discharge status: Home / Self Care | Payer: Self-pay | Source: Ambulatory Visit | Attending: Obstetrics and Gynecology | Admitting: Obstetrics and Gynecology

## 2022-09-04 ENCOUNTER — Encounter (HOSPITAL_COMMUNITY): Payer: Self-pay

## 2022-09-04 DIAGNOSIS — R928 Other abnormal and inconclusive findings on diagnostic imaging of breast: Secondary | ICD-10-CM

## 2022-09-18 ENCOUNTER — Ambulatory Visit: Payer: 59 | Admitting: Obstetrics & Gynecology

## 2022-10-02 ENCOUNTER — Encounter: Payer: 59 | Admitting: Obstetrics & Gynecology

## 2022-10-20 ENCOUNTER — Encounter (HOSPITAL_COMMUNITY): Payer: Self-pay | Admitting: Emergency Medicine

## 2022-10-20 ENCOUNTER — Emergency Department (HOSPITAL_COMMUNITY): Payer: 59

## 2022-10-20 ENCOUNTER — Other Ambulatory Visit: Payer: Self-pay

## 2022-10-20 ENCOUNTER — Emergency Department (HOSPITAL_COMMUNITY)
Admission: EM | Admit: 2022-10-20 | Discharge: 2022-10-20 | Disposition: A | Discharge status: Home / Self Care | Payer: 59 | Attending: Emergency Medicine | Admitting: Emergency Medicine

## 2022-10-20 DIAGNOSIS — M25531 Pain in right wrist: Secondary | ICD-10-CM | POA: Diagnosis not present

## 2022-10-20 DIAGNOSIS — M7989 Other specified soft tissue disorders: Secondary | ICD-10-CM | POA: Diagnosis not present

## 2022-10-20 LAB — BASIC METABOLIC PANEL
Anion gap: 7 (ref 5–15)
BUN: 12 mg/dL (ref 6–20)
CO2: 22 mmol/L (ref 22–32)
Calcium: 8.5 mg/dL — ABNORMAL LOW (ref 8.9–10.3)
Chloride: 108 mmol/L (ref 98–111)
Creatinine, Ser: 0.69 mg/dL (ref 0.44–1.00)
GFR, Estimated: >60 mL/min (ref >60)
Glucose, Bld: 102 mg/dL — ABNORMAL HIGH (ref 70–99)
Potassium: 3.4 mmol/L — ABNORMAL LOW (ref 3.5–5.1)
Sodium: 137 mmol/L (ref 135–145)

## 2022-10-20 LAB — CBC WITH DIFFERENTIAL/PLATELET
Abs Immature Granulocytes: 0.06 10*3/uL (ref 0.00–0.07)
Basophils Absolute: 0.1 10*3/uL (ref 0.0–0.1)
Basophils Relative: 1 %
Eosinophils Absolute: 0.3 10*3/uL (ref 0.0–0.5)
Eosinophils Relative: 4 %
HCT: 32.9 % — ABNORMAL LOW (ref 36.0–46.0)
Hemoglobin: 10.3 g/dL — ABNORMAL LOW (ref 12.0–15.0)
Immature Granulocytes: 1 %
Lymphocytes Relative: 27 %
Lymphs Abs: 2.3 10*3/uL (ref 0.7–4.0)
MCH: 25.9 pg — ABNORMAL LOW (ref 26.0–34.0)
MCHC: 31.3 g/dL (ref 30.0–36.0)
MCV: 82.9 fL (ref 80.0–100.0)
Monocytes Absolute: 0.7 10*3/uL (ref 0.1–1.0)
Monocytes Relative: 8 %
Neutro Abs: 5 10*3/uL (ref 1.7–7.7)
Neutrophils Relative %: 59 %
Platelets: 480 10*3/uL — ABNORMAL HIGH (ref 150–400)
RBC: 3.97 MIL/uL (ref 3.87–5.11)
RDW: 15.9 % — ABNORMAL HIGH (ref 11.5–15.5)
WBC: 8.4 10*3/uL (ref 4.0–10.5)
nRBC: 0 % (ref 0.0–0.2)

## 2022-10-20 NOTE — ED Triage Notes (Signed)
Pt present with intermittent right hand swelling for over a month, denies injury.

## 2022-10-20 NOTE — Discharge Instructions (Signed)
Please follow-up with the orthopedic doctor as instructed, return to the ER if you have any worsening pain, swelling, loss of sensation in your hand or no pulse.  Wear the brace is much as you can, and reduce your repetitive motions, to help alleviate some of the strain on your wrist

## 2022-10-20 NOTE — ED Provider Notes (Signed)
[place] EMERGENCY DEPARTMENT AT [facility] Provider Note   CSN: [id number] Arrival date & time: 10/20/22  1414     History  Chief Complaint  Patient presents with   Hand Swelling, Right    Brittany Hood is a 47 y.o. female, no pertinent past medical history, who presents to the ED secondary right wrist, pain that is been going on for the last month.  She states that hurts when she does things, and is worse at night.  She states sometimes her hand swells because the pain is so bad.  Denies any redness to the hand or the wrist, or fevers.  Denies any IV drug use.  No known trauma.  Has been using Tylenol and ibuprofen with some relief.  Also ices it, with some relief.  States that she works at [employer], and is often on [employer], is right-hand dominant.  Also likes to play games on her phone  Home Medications Prior to Admission medications   Medication Sig Start Date End Date Taking? Authorizing Provider  medroxyPROGESTERone Acetate 150 MG/ML SUSY Inject 1 mL into the muscle every 14 (fourteen) days. 07/18/22   [provider]  megestrol (MEGACE) 40 MG tablet Take 2 tablets (80 mg total) by mouth 2 (two) times daily. 06/28/22   Burgess Amor, PA-C  naproxen sodium (ALEVE) 220 MG tablet Take 440 mg by mouth daily as needed (pain).    [provider]      Allergies    Penicillins    Review of Systems   Review of Systems  Constitutional:  Negative for fever.  Musculoskeletal:  Positive for arthralgias.  Skin:  Negative for rash.    Physical Exam Updated Vital Signs BP (!) 135/95   Pulse 100   Temp 99.3 F (37.4 C) (Oral)   Resp 18   Ht 5\' 2"  (1.575 m)   Wt 86.6 kg   SpO2 100%   BMI 34.93 kg/m  Physical Exam Vitals and nursing note reviewed.  Constitutional:      General: She is not in acute distress.    Appearance: She is well-developed.  HENT:     Head: Normocephalic and atraumatic.  Eyes:     General:        Right  eye: No discharge.        Left eye: No discharge.     Conjunctiva/sclera: Conjunctivae normal.  Pulmonary:     Effort: No respiratory distress.  Musculoskeletal:     Comments: No edema, or point tenderness, the hand, tenderness to palpation of the wrist, particularly along medial aspect of volar aspect of the wrist. +Tinels test. Radial pulses present. Grip strength intact. Able to flex, extend, ulnar and radial deviate wrist. Two point discrimination intact. Normal thumb opposition. Intact ROM for all MCPs, PIPs, and DIPs.  No snuffbox ttp. No sensory deficits. Capillary refill <2sec   Neurological:     Mental Status: She is alert.     Comments: Clear speech.   Psychiatric:        Behavior: Behavior normal.        Thought Content: Thought content normal.     ED Results / Procedures / Treatments   Labs (all labs ordered are listed, but only abnormal results are displayed) Labs Reviewed  CBC WITH DIFFERENTIAL/PLATELET - Abnormal; Notable for the following components:      Result Value   Hemoglobin 10.3 (*)    HCT 32.9 (*)    MCH 25.9 (*)  RDW 15.9 (*)    Platelets 480 (*)    All other components within normal limits  BASIC METABOLIC PANEL - Abnormal; Notable for the following components:   Potassium 3.4 (*)    Glucose, Bld 102 (*)    Calcium 8.5 (*)    All other components within normal limits    EKG None  Radiology DG Hand Complete Right  Result Date: 10/20/2022 CLINICAL DATA:  One-month history of intermittent right hand swelling. No known injury. EXAM: RIGHT HAND - COMPLETE 3 VIEW COMPARISON:  None Available. FINDINGS: There is no evidence of fracture or dislocation. There is no evidence of arthropathy or other focal bone abnormality. Soft tissues are unremarkable. IMPRESSION: No focal radiographic abnormality. Electronically Signed   By: Agustin Cree M.D.   On: 10/20/2022 15:11    Procedures Procedures    Medications Ordered in ED Medications - No data to  display  ED Course/ Medical Decision Making/ A&P                                 Medical Decision Making Patient is a 47 year old female, here for wrist pain, this been going on for the last month, worse when sleeping, states he get swollen at times, and she has repetitive job.  She has tenderness to palpation over the volar aspect of her wrist, along the median nerve, and she states it intermittently goes numb.  She has a positive Tinel's test, we will put her in a splint, have her follow-up with orthopedics, return if symptoms worsen.  Given that it does not get red, and the pain comes and goes, worse with movement, I do not think it is likely gout, I think is likely carpal tunnel related.  I sent her over to orthopedics, and will have her follow-up.  Return precautions emphasized.  She has good pulse, no sensory deficits on my exam  Amount and/or Complexity of Data Reviewed Labs: ordered. Radiology: ordered.    Final Clinical Impression(s) / ED Diagnoses Final diagnoses:  Right wrist pain    Rx / DC Orders ED Discharge Orders     None         Aldena Worm, [place]ley [place], PA 10/20/22 1821    Eber Hong, MD 10/22/22 [phone]

## 2022-10-22 ENCOUNTER — Encounter: Payer: Self-pay | Admitting: Obstetrics & Gynecology

## 2022-10-22 ENCOUNTER — Ambulatory Visit (INDEPENDENT_AMBULATORY_CARE_PROVIDER_SITE_OTHER): Payer: 59 | Admitting: Obstetrics & Gynecology

## 2022-10-22 VITALS — BP 124/81 | HR 96 | Ht 62.0 in | Wt 186.0 lb

## 2022-10-22 DIAGNOSIS — N920 Excessive and frequent menstruation with regular cycle: Secondary | ICD-10-CM | POA: Diagnosis not present

## 2022-10-22 DIAGNOSIS — N938 Other specified abnormal uterine and vaginal bleeding: Secondary | ICD-10-CM

## 2022-10-22 DIAGNOSIS — Z30014 Encounter for initial prescription of intrauterine contraceptive device: Secondary | ICD-10-CM | POA: Diagnosis not present

## 2022-10-22 DIAGNOSIS — D500 Iron deficiency anemia secondary to blood loss (chronic): Secondary | ICD-10-CM

## 2022-10-22 DIAGNOSIS — D219 Benign neoplasm of connective and other soft tissue, unspecified: Secondary | ICD-10-CM | POA: Diagnosis not present

## 2022-10-22 MED ORDER — LEVONORGESTREL 20 MCG/DAY IU IUD
1.0000 | INTRAUTERINE_SYSTEM | Freq: Once | INTRAUTERINE | Status: AC
  Administered 2022-10-22: 1 via INTRAUTERINE

## 2022-10-22 NOTE — Progress Notes (Signed)
Follow up appointment for results: menorrhagia  Chief Complaint  Patient presents with   Menorrhagia    Missed period for three months and then it came back very heavy. Was seen in ER in May.     Blood pressure 124/81, pulse 96, height 5\' 2"  (1.575 m), weight 186 lb (84.4 kg), last menstrual period 10/15/2022. [id number]  Narrative & Impression  CLINICAL DATA:  Pelvic pain and vaginal bleeding   EXAM: TRANSABDOMINAL ULTRASOUND OF PELVIS   TECHNIQUE: Transabdominal ultrasound examination of the pelvis was performed including evaluation of the uterus, ovaries, adnexal regions, and pelvic cul-de-sac.   COMPARISON:  Ultrasound pelvis dated 05/29/2010   FINDINGS: Uterus   Measurements: 10.6 cm in sagittal dimension. Heterogeneously hypoechoic focus along the right lower uterine body measures 4.8 x 4.4 x 4.1 cm.   Endometrium   Thickness: 12 mm.  No focal abnormality visualized.   Right ovary   Not seen.   Left ovary   Measurements: 5.6 x 5.4 x 2.7 cm = volume: 41.9 mL. Contains a simple cyst measuring 5.2 x 4.0 x 3.1 cm.   Other findings:  No abnormal free fluid.   IMPRESSION: 1. Heterogeneously hypoechoic focus along the right lower uterine body measures 4.8 x 4.4 x 4.1 cm, possibly a fibroid, although a right adnexal mass is within the differential. Consider nonemergent contrast-enhanced MRI pelvis for further evaluation. 2. Simple left ovarian cyst measuring 5.2 x 4.0 x 3.1 cm. Recommend follow-up pelvic ultrasound examination in 3-6 weeks to ensure resolution. 3. Right ovary not seen.     Electronically Signed   By: Agustin Cree M.D.   On: 06/27/2022 17:00     IUD Insertion Procedure Note  Pre-operative Diagnosis: perimenopausal HMB with anemia  Post-operative Diagnosis: normal  Indications: contraception  Procedure Details  Urine pregnancy test was not done.  The risks (including infection, bleeding, pain, and uterine perforation) and benefits of the  procedure were explained to the patient and Written informed consent was obtained.    Cervix cleansed with Betadine. Uterus sounded to 8 cm. IUD inserted without difficulty. String visible and trimmed. Patient tolerated procedure well.  IUD Information: Mirena, Lot # [id number] Expiration date August 2026.  Condition: Stable  Complications: None  Plan:  The patient was advised to call for any fever or for prolonged or severe pain or bleeding. She was advised to use OTC ibuprofen as needed for mild to moderate pain.   Attending Physician Documentation: I placed the IUD  Conttinue the megestrol til 10/31 then stop  MEDS ordered this encounter: Meds ordered this encounter  Medications   levonorgestrel (MIRENA) 20 MCG/DAY IUD 1 each    Orders for this encounter: No orders of the defined types were placed in this encounter.   Impression + Management Plan   ICD-10-CM   1. Menorrhagia with regular cycle  N92.0     2. DUB (dysfunctional uterine bleeding) with megestrol  N93.8     3. 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 Fibroids  D21.9       Follow Up: Return in about 4 months (around 02/21/2023) for Follow up, with Dr Despina Hidden.     All questions were answered.  Past Medical History:  Diagnosis Date   Scoliosis    Vertigo     History reviewed. No pertinent surgical history.  OB History     Gravida  2   Para  2   Term  2   Preterm      AB      Living  2      SAB      IAB      Ectopic      Multiple      Live Births              Allergies  Allergen Reactions   Penicillins     Social History   Socioeconomic History   Marital status: Married    Spouse name: Not on file   Number of children: 2   Years of education: Not on file   Highest education level: Master's degree (e.g., MA, MS, MEng, MEd, MSW, MBA)  Occupational History   Not on file  Tobacco Use   Smoking status: Never   Smokeless tobacco:  Never  Vaping Use   Vaping status: Never Used  Substance and Sexual Activity   Alcohol use: Never   Drug use: Never   Sexual activity: Yes    Birth control/protection: Injection  Other Topics Concern   Not on file  Social History Narrative   Not on file   Social Determinants of Health   Financial Resource Strain: Not on file  Food Insecurity: No Food Insecurity (08/24/2022)   Hunger Vital Sign    Worried About Running Out of Food in the Last Year: Never true    Ran Out of Food in the Last Year: Never true  Transportation Needs: No Transportation Needs (08/24/2022)   PRAPARE - Administrator, Civil Service (Medical): No    Lack of Transportation (Non-Medical): No  Physical Activity: Not on file  Stress: Not on file  Social Connections: Not on file    Family History  Problem Relation Age of Onset   Heart attack Mother    Kidney failure Maternal Grandmother    Heart attack Maternal Grandfather

## 2022-11-08 ENCOUNTER — Ambulatory Visit: Payer: 59 | Admitting: Orthopedic Surgery

## 2022-11-22 ENCOUNTER — Ambulatory Visit: Payer: 59 | Admitting: Orthopedic Surgery

## 2022-12-04 ENCOUNTER — Other Ambulatory Visit: Payer: Self-pay | Admitting: Obstetrics & Gynecology

## 2022-12-04 ENCOUNTER — Telehealth: Payer: Self-pay | Admitting: *Deleted

## 2022-12-04 MED ORDER — MEGESTROL ACETATE 40 MG PO TABS: ORAL_TABLET | ORAL | 11 refills | Status: AC

## 2022-12-04 NOTE — Telephone Encounter (Signed)
Patient called requesting refill on Megace.  States she was advised at her last visit in September to take 1 tablet daily for 24 days then off for 4 days.  She is down to 1 tablet.  Advised to check back with her pharmacy later this afternoon if she does not hear from [place].

## 2023-01-08 ENCOUNTER — Encounter: Payer: 59 | Admitting: Obstetrics and Gynecology

## 2023-02-27 NOTE — Progress Notes (Unsigned)
  Intake history:  There were no vitals taken for this visit. There is no height or weight on file to calculate BMI.    WHAT ARE WE SEEING YOU FOR TODAY?   {Location ext UXLK:44010}  How long has this bothered you? (DOI?DOS?WS?)  {TIME; AGO:17960}  Anticoag.  No  Diabetes {yes/no:20286}  Heart disease {yes/no:20286}  Hypertension {yes/no:20286}  SMOKING HX No  Kidney disease {yes/no:20286}  Any ALLERGIES ______________________________________________   Treatment:  Have you taken:  Tylenol {yes/no:20286}  Advil {yes/no:20286}  Had PT {yes/no:20286}  Had injection {yes/no:20286}  Other  _________________________

## 2023-02-28 ENCOUNTER — Ambulatory Visit: Payer: 59 | Admitting: Orthopedic Surgery

## 2023-02-28 ENCOUNTER — Encounter: Payer: Self-pay | Admitting: Orthopedic Surgery

## 2023-02-28 VITALS — BP 125/97 | HR 91 | Ht 62.0 in | Wt 186.0 lb

## 2023-02-28 DIAGNOSIS — M79641 Pain in right hand: Secondary | ICD-10-CM | POA: Diagnosis not present

## 2023-02-28 DIAGNOSIS — M79642 Pain in left hand: Secondary | ICD-10-CM | POA: Diagnosis not present

## 2023-02-28 DIAGNOSIS — R202 Paresthesia of skin: Secondary | ICD-10-CM | POA: Diagnosis not present

## 2023-02-28 NOTE — Progress Notes (Signed)
Office Visit Note   Patient: Brittany Hood           Date of Birth: 1975-08-09           MRN: [id number] Visit Date: 02/28/2023 Requested by: No referring provider defined for this encounter. PCP: Patient, No Pcp Per   Assessment & Plan:   Encounter Diagnoses  Name Primary?   Bilateral hand pain Yes   Left hand paresthesia    Right hand paresthesia     No orders of the defined types were placed in this encounter.   48 year old female unclear etiology of bilateral symptoms in her hand to include intermittent weakness intermittent pain some numbness and tingling at times loss of function such as simple activities such as holding a cup or brushing her teeth recommend nerve conduction study as there is no clear etiology of this constellation of symptoms.  Does not want to steroid , so she is on ibuprofen we will continue that   Subjective: Chief Complaint  Patient presents with   Hand Pain    Bilateral     HPI: 48 year old female presents with bilateral hand pain loss of function weakness loss of dexterity some paresthesias type symptoms currently left side is symptomatic right side is not pain over the volar aspect of the wrist and into the forearm does not go above the elbow              ROS: Scoliosis, knee pain, comes in in a wheelchair because of knee symptoms and back pain   Images personally read and my interpretation : Imaging of the right hand negative seen by ER.  I saw the x-rays.  I agree no fracture no arthritis  Visit Diagnoses:  1. Bilateral hand pain   2. Left hand paresthesia   3. Right hand paresthesia      Follow-Up Instructions: Return for FOLLOW UP, NCS,EMG RESULTS.    Objective: Vital Signs: BP (!) 125/97   Pulse 91   Ht 5\' 2"  (1.575 m)   Wt 186 lb (84.4 kg)   BMI 34.02 kg/m   Physical Exam Vitals and nursing note reviewed.  Constitutional:      Appearance: Normal appearance.  HENT:     Head: Normocephalic and atraumatic.  Eyes:      General: No scleral icterus.       Right eye: No discharge.        Left eye: No discharge.     Extraocular Movements: Extraocular movements intact.     Conjunctiva/sclera: Conjunctivae normal.     Pupils: Pupils are equal, round, and reactive to light.  Cardiovascular:     Rate and Rhythm: Normal rate.     Pulses: Normal pulses.  Skin:    General: Skin is warm and dry.     Capillary Refill: Capillary refill takes less than 2 seconds.  Neurological:     General: No focal deficit present.     Mental Status: She is alert and oriented to person, place, and time.  Psychiatric:        Mood and Affect: Mood normal.        Behavior: Behavior normal.        Thought Content: Thought content normal.        Judgment: Judgment normal.      Ortho Exam Both hands look normal she can make a full fist on the right cannot make 1 on the left the right side is asymptomatic  On the left side the  lateral epicondyle is nontender.  The forearm is tender in the soft tissues of the volar side she has decreased flexion strength on the left side and some sensory loss of the fingertips of the long and ring finger    Specialty Comments:  No specialty comments available.  Imaging: No results found.   PMFS History: Patient Active Problem List   Diagnosis Date Noted   Migraine headache 11/27/2006   SYMPTOM, COUGH 11/27/2006   Past Medical History:  Diagnosis Date   Scoliosis    Vertigo     Family History  Problem Relation Age of Onset   Heart attack Mother    Kidney failure Maternal Grandmother    Heart attack Maternal Grandfather     History reviewed. No pertinent surgical history. Social History   Occupational History   Not on file  Tobacco Use   Smoking status: Never   Smokeless tobacco: Never  Vaping Use   Vaping status: Never Used  Substance and Sexual Activity   Alcohol use: Never   Drug use: Never   Sexual activity: Yes    Birth control/protection: Injection

## 2023-02-28 NOTE — Patient Instructions (Signed)
We are referring you to [facility] from [facility] address is [address] Brittany Hood The phone number is [phone]  The office will call you with an appointment Dr. Alvester Morin

## 2023-03-07 ENCOUNTER — Telehealth: Payer: Self-pay | Admitting: Physical Medicine and Rehabilitation

## 2023-03-07 NOTE — Telephone Encounter (Signed)
 Received call from patient needing to reschedule her appointment with Dr. Daisey Dryer for NCS. The number to contact patient is [phone]

## 2023-03-09 ENCOUNTER — Emergency Department (HOSPITAL_COMMUNITY): Payer: 59

## 2023-03-09 ENCOUNTER — Emergency Department (HOSPITAL_COMMUNITY)
Admission: EM | Admit: 2023-03-09 | Discharge: 2023-03-09 | Disposition: A | Discharge status: Home / Self Care | Payer: 59 | Attending: Emergency Medicine | Admitting: Emergency Medicine

## 2023-03-09 DIAGNOSIS — G8929 Other chronic pain: Secondary | ICD-10-CM | POA: Diagnosis not present

## 2023-03-09 DIAGNOSIS — M25461 Effusion, right knee: Secondary | ICD-10-CM | POA: Diagnosis not present

## 2023-03-09 DIAGNOSIS — M25561 Pain in right knee: Secondary | ICD-10-CM | POA: Diagnosis not present

## 2023-03-09 DIAGNOSIS — W19XXXA Unspecified fall, initial encounter: Secondary | ICD-10-CM | POA: Diagnosis not present

## 2023-03-09 DIAGNOSIS — S80911A Unspecified superficial injury of right knee, initial encounter: Secondary | ICD-10-CM | POA: Diagnosis not present

## 2023-03-09 MED ORDER — ACETAMINOPHEN 500 MG PO TABS
1000.0000 mg | ORAL_TABLET | Freq: Once | ORAL | Status: AC
  Administered 2023-03-09: 1000 mg via ORAL
  Filled 2023-03-09: qty 2

## 2023-03-09 MED ORDER — NAPROXEN 500 MG PO TABS: 500.0000 mg | ORAL_TABLET | Freq: Two times a day (BID) | ORAL | 0 refills | Status: DC

## 2023-03-09 NOTE — ED Triage Notes (Signed)
 Pt c/o R knee pain for one month. States that at that time she had a fall d/t losing her balance. Pt walking with slight limp to triage.

## 2023-03-09 NOTE — ED Provider Notes (Signed)
 [place] EMERGENCY DEPARTMENT AT [facility] Provider Note   CSN: [id number] Arrival date & time: 03/09/23  1223     History  Chief Complaint  Patient presents with   Knee Pain    Brittany Hood is a 48 y.o. female patient with past medical history of scoliosis presenting to emergency room with intermittent right knee pain for approximately 1 year.  Patient reports that she had a fall last year while going up the steps twisting and falling.  Reports she has had intermittent knee pain since.  Reports about 1 month ago she noticed right knee swelling and pain while walking up the stairs.  Patient reports difficulty ambulating secondary to pain.  Denies any laxity or injury within the past month.  Denies any calf tenderness or swelling. Denies any infections symptoms/ cellulitis,or wound.  Has not had any prior surgeries on this joint. Has intermittently tried Tylenol  and ibuprofen at home.   Knee Pain      Home Medications Prior to Admission medications   Medication Sig Start Date End Date Taking? Authorizing Provider  medroxyPROGESTERone  Acetate 150 MG/ML SUSY Inject 1 mL into the muscle every 14 (fourteen) days. 07/18/22   [provider]  megestrol  (MEGACE ) 40 MG tablet 24 days on 4 days off 12/04/22   Jayne Vonn DEL, MD  naproxen  sodium (ALEVE ) 220 MG tablet Take 440 mg by mouth daily as needed (pain).    [provider]      Allergies    Penicillins    Review of Systems   Review of Systems  Musculoskeletal:  Positive for arthralgias.    Physical Exam Updated Vital Signs BP 131/88 (BP Location: Right Arm)   Pulse 92   Temp 99 F (37.2 C) (Oral)   Resp 16   Ht 5' 2 (1.575 m)   Wt 84.4 kg   SpO2 99%   BMI 34.02 kg/m  Physical Exam Vitals and nursing note reviewed.  Constitutional:      General: She is not in acute distress.    Appearance: She is not toxic-appearing.  HENT:     Head: Normocephalic and atraumatic.  Eyes:      General: No scleral icterus.    Conjunctiva/sclera: Conjunctivae normal.  Cardiovascular:     Rate and Rhythm: Normal rate and regular rhythm.     Pulses: Normal pulses.     Heart sounds: Normal heart sounds.  Pulmonary:     Effort: Pulmonary effort is normal. No respiratory distress.     Breath sounds: Normal breath sounds.  Abdominal:     General: Abdomen is flat. Bowel sounds are normal.     Palpations: Abdomen is soft.     Tenderness: There is no abdominal tenderness.  Musculoskeletal:        General: Tenderness present.     Right lower leg: No edema.     Left lower leg: No edema.     Comments: Right knee pain, tenderness over medial aspect of knee joint, tenderness to palpation over patellar tendon.  No obvious deformity, ecchymosis or erythema.  Strong dorsal pedal pulses.  Skin:    General: Skin is warm and dry.     Findings: No lesion.  Neurological:     General: No focal deficit present.     Mental Status: She is alert and oriented to person, place, and time. Mental status is at baseline.     ED Results / Procedures / Treatments   Labs (all labs ordered are  listed, but only abnormal results are displayed) Labs Reviewed - No data to display  EKG None  Radiology DG Knee Complete 4 Views Right Result Date: 03/09/2023 CLINICAL DATA:  Fall, right knee pain EXAM: RIGHT KNEE - COMPLETE 4+ VIEW COMPARISON:  Radiographs 12/30/2006 FINDINGS: No acute fracture or dislocation. Moderate knee joint effusion. Soft tissues are radiographically unremarkable. IMPRESSION: No acute fracture or dislocation. Moderate knee joint effusion. Electronically Signed   By: Norman Gatlin M.D.   On: 03/09/2023 13:24    Procedures Procedures    Medications Ordered in ED Medications  acetaminophen  (TYLENOL ) tablet 1,000 mg (has no administration in time range)    ED Course/ Medical Decision Making/ A&P                                 Medical Decision Making Amount and/or Complexity of  Data Reviewed Radiology: ordered.  Risk OTC drugs. Prescription drug management.   This patient presents to the ED for concern of right knee pain, this involves an extensive number of treatment options, and is a complaint that carries with it a high risk of complications and morbidity.  The differential diagnosis includes osteoarthritis, septic joint, fracture, strain, sprain, tendinitis   Co morbidities that complicate the patient evaluation  Migraines  Imaging Studies ordered:  I ordered imaging studies including x-ray of right knee I independently visualized and interpreted imaging which showed small joint effusion without any acute abnormality I agree with the radiologist interpretation   Problem List / ED Course / Critical interventions / Medication management  Patient reported to emergency room with right knee pain.  Patient denies any recent injury trauma or fall.  Patient is able to ambulate.  She is well-appearing.  Physical exam without any sign of infection.  No sign of septic joint or cellulitis.  Patient does have focal area tenderness over insertion point over talar tendon consistent with patellar tendinitis.  X-ray without any acute fracture or abnormality.  Will order knee brace, recommended rest ice elevation over-the-counter management of symptoms.  Patient will follow-up with primary care.  Given primary care resources.  Patient stable for discharge.  I ordered medication including Tylenol  and knee brace  Reevaluation of the patient after these medicines showed that the patient improved I have reviewed the patients home medicines and have made adjustments as needed   Plan  F/u w/ PCP in 2-3d to ensure resolution of sx.  Patient was given return precautions. Patient stable for discharge at this time.  Patient educated on sx/dx and verbalized understanding of plan. Return to ER w/ new or worsening sx.          Final Clinical Impression(s) / ED  Diagnoses Final diagnoses:  Chronic pain of right knee    Rx / DC Orders ED Discharge Orders     None         Shermon Warren SAILOR, PA-C 03/09/23 2036    Franklyn Sid SAILOR, MD 03/11/23 [phone]

## 2023-03-09 NOTE — Discharge Instructions (Addendum)
 You were seen in the emergency room today for knee pain.  Please wear your knee brace while you are up walking around.  You can take Tylenol  1000 mg 4 times a day.  I sent naproxen  to your pharmacy to take twice a day.  Make sure you take the naproxen  with food.  You can also apply ice or heat over your knee.  I recommend elevating your knee while propped up on a pillow as well.  Please establish with primary care doctor to ensure follow-up.  Return to emergency room if you have any new or worsening symptoms.

## 2023-03-12 ENCOUNTER — Encounter: Payer: 59 | Admitting: Physical Medicine and Rehabilitation

## 2023-03-26 ENCOUNTER — Ambulatory Visit (INDEPENDENT_AMBULATORY_CARE_PROVIDER_SITE_OTHER): Payer: 59 | Admitting: Physical Medicine and Rehabilitation

## 2023-03-26 DIAGNOSIS — R202 Paresthesia of skin: Secondary | ICD-10-CM

## 2023-03-26 DIAGNOSIS — M79642 Pain in left hand: Secondary | ICD-10-CM | POA: Diagnosis not present

## 2023-03-26 DIAGNOSIS — M79641 Pain in right hand: Secondary | ICD-10-CM | POA: Diagnosis not present

## 2023-03-26 NOTE — Progress Notes (Unsigned)
 Pain Score----10 Patient advising she has numbness and tingling bilateral hands. C/O tingling and weakness, states she is unable to write anymore.

## 2023-03-27 NOTE — Procedures (Unsigned)
 EMG & NCV Findings: Evaluation of the right median motor nerve showed prolonged distal onset latency (4.6 ms), reduced amplitude (4.1 mV), and decreased conduction velocity (Elbow-Wrist, 47 m/s).  The left median (across palm) sensory nerve showed no response (Wrist) and no response (Palm).  The right median (across palm) sensory nerve showed prolonged distal peak latency (Wrist, 4.8 ms).  The left ulnar sensory nerve showed reduced amplitude (5.7 V).  All remaining nerves (as indicated in the following tables) were within normal limits.  Left vs. Right side comparison data for the median motor nerve indicates abnormal L-R velocity difference (Elbow-Wrist, 12 m/s).  The ulnar motor nerve indicates abnormal L-R velocity difference (A Elbow-B Elbow, 22 m/s).  The ulnar sensory nerve indicates abnormal L-R latency difference (0.5 ms) and abnormal L-R amplitude difference (73.5 %).    Needle evaluation of the right abductor pollicis brevis muscle showed increased insertional activity, increased spontaneous activity, and diminished recruitment.  All remaining muscles (as indicated in the following table) showed no evidence of electrical instability.    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   a mild to moderate left median nerve entrapment at the wrist (carpal tunnel syndrome) affecting sensory components.  There is no significant electrodiagnostic evidence of any other focal nerve entrapment, brachial plexopathy or cervical radiculopathy.   Recommendations: 1.  Follow-up with referring physician. 2.  Continue current management of symptoms. 3.  Continue use of resting splint at night-time and as needed during the day. 4.  Suggest surgical evaluation.  ___________________________ Naaman Plummer FAAPMR Board Certified, American Board of Physical  Medicine and Rehabilitation    Nerve Conduction Studies Anti Sensory Summary Table   Stim Site NR Peak (ms) Norm Peak (ms) P-T Amp (V) Norm P-T Amp Site1 Site2 Delta-P (ms) Dist (cm) Vel (m/s) Norm Vel (m/s)  Left Median Acr Palm Anti Sensory (2nd Digit)  29.3C  Wrist *NR  <3.6  >10 Wrist Palm  0.0    Palm *NR  <2.0              6.0  2.7         Right Median Acr Palm Anti Sensory (2nd Digit)  30.4C  Wrist    *4.8 <3.6 14.1 >10 Wrist Palm 3.7 0.0    Palm    1.1 <2.0 23.1         Left Radial Anti Sensory (Base 1st Digit)  30.4C  Wrist    2.2 <3.1 4.8  Wrist Base 1st Digit 2.2 0.0    Right Radial Anti Sensory (Base 1st Digit)  30.5C  Wrist    1.9 <3.1 15.5  Wrist Base 1st Digit 1.9 0.0    Left Ulnar Anti Sensory (5th Digit)  30C  Wrist    3.3 <3.7 *5.7 >15.0 Wrist 5th Digit 3.3 14.0 42 >38  Right Ulnar Anti Sensory (5th Digit)  31C  Wrist    2.8 <3.7 21.5 >15.0 Wrist 5th Digit 2.8 14.0 50 >38   Motor Summary Table   Stim Site NR Onset (ms) Norm Onset (ms) O-P Amp (mV) Norm O-P Amp Site1 Site2 Delta-0 (ms) Dist (cm) Vel (m/s) Norm Vel (m/s)  Left Median Motor (Abd Poll Brev)  30.9C  Wrist    4.1 <4.2 6.6 >5 Elbow Wrist 3.9 23.0 59 >50  Elbow    8.0  7.2  Right Median Motor (Abd Poll Brev)  31C  Wrist    *4.6 <4.2 *4.1 >5 Elbow Wrist 4.9 23.0 *47 >50  Elbow    9.5  4.2         Left Ulnar Motor (Abd Dig Min)  31.5C  Wrist    3.0 <4.2 7.2 >3 B Elbow Wrist 3.2 20.0 63 >53  B Elbow    6.2  6.5  A Elbow B Elbow 1.1 11.0 100 >53  A Elbow    7.3  7.4         Right Ulnar Motor (Abd Dig Min)  31.4C  Wrist    2.6 <4.2 7.8 >3 B Elbow Wrist 3.6 21.0 58 >53  B Elbow    6.2  7.1  A Elbow B Elbow 0.9 11.0 122 >53  A Elbow    7.1  7.3          EMG   Side Muscle Nerve Root Ins Act Fibs Psw Amp Dur Poly Recrt Int Dennie Bible Comment  Right Abd Poll Brev Median C8-T1 *Incr *3+ *3+ Nml Nml 0 *Reduced Nml   Right 1stDorInt Ulnar C8-T1 Nml Nml Nml Nml Nml 0 Nml Nml   Right PronatorTeres  Median C6-7 Nml Nml Nml Nml Nml 0 Nml Nml   Right Biceps Musculocut C5-6 Nml Nml Nml Nml Nml 0 Nml Nml   Right Deltoid Axillary C5-6 Nml Nml Nml Nml Nml 0 Nml Nml     Nerve Conduction Studies Anti Sensory Left/Right Comparison   Stim Site L Lat (ms) R Lat (ms) L-R Lat (ms) L Amp (V) R Amp (V) L-R Amp (%) Site1 Site2 L Vel (m/s) R Vel (m/s) L-R Vel (m/s)  Median Acr Palm Anti Sensory (2nd Digit)  29.3C  Wrist  *4.8   14.1  Wrist Palm     Palm  1.1   23.1         6.0   2.7         Radial Anti Sensory (Base 1st Digit)  30.4C  Wrist 2.2 1.9 0.3 4.8 15.5 69.0 Wrist Base 1st Digit     Ulnar Anti Sensory (5th Digit)  30C  Wrist 3.3 2.8 *0.5 *5.7 21.5 *73.5 Wrist 5th Digit 42 50 8   Motor Left/Right Comparison   Stim Site L Lat (ms) R Lat (ms) L-R Lat (ms) L Amp (mV) R Amp (mV) L-R Amp (%) Site1 Site2 L Vel (m/s) R Vel (m/s) L-R Vel (m/s)  Median Motor (Abd Poll Brev)  30.9C  Wrist 4.1 *4.6 0.5 6.6 *4.1 37.9 Elbow Wrist 59 *47 *12  Elbow 8.0 9.5 1.5 7.2 4.2 41.7       Ulnar Motor (Abd Dig Min)  31.5C  Wrist 3.0 2.6 0.4 7.2 7.8 7.7 B Elbow Wrist 63 58 5  B Elbow 6.2 6.2 0.0 6.5 7.1 8.5 A Elbow B Elbow 100 122 *22  A Elbow 7.3 7.1 0.2 7.4 7.3 1.4          Waveforms:

## 2023-03-29 ENCOUNTER — Encounter: Payer: Self-pay | Admitting: Physical Medicine and Rehabilitation

## 2023-03-29 NOTE — Progress Notes (Signed)
 Brittany Hood - 48 y.o. female MRN [id number]  Date of birth: Nov 07, 1975  Office Visit Note: Visit Date: 03/26/2023 PCP: Patient, No Pcp Per Referred by: Vickki Hearing, MD  Subjective: Chief Complaint  Patient presents with   Left Hand - Numbness, Weakness, Pain   Right Hand - Pain, Numbness, Weakness   HPI: Brittany Hood is a 48 y.o. female who comes in today at the request of Dr. Fuller Canada for evaluation and management of chronic, worsening and severe pain, numbness and tingling in the Bilateral upper extremities.  Patient is Right hand dominant.  She reports chronic now progressive pain numbness and tingling somewhat globally in both hands fairly equal but more right than left.  She gets symptoms that can occur at random times but she does report some nocturnal complaints more than others.  Worsening symptoms with activity and using her hands.  She has noted weakness and loss of dexterity.  She does have a history of some neck pain but not really frank radicular symptoms down the arms.  No history of diabetes or thyroid disease.  She does carry a diagnosis of migraine headache.  She has been using anti-inflammatory such as Aleve with some relief but not much.  She has not had any electrodiagnostic studies or other workup.  No other neurologic complaints.  No advanced imaging of the spine but prior cervical x-ray from 2021 showed some mid thoracic scoliosis which was mild.   I spent more than 30 minutes speaking face-to-face with the patient with 50% of the time in counseling and discussing coordination of care.      Review of Systems  Musculoskeletal:  Positive for joint pain.  Neurological:  Positive for tingling and weakness.  All other systems reviewed and are negative.  Otherwise per HPI.  Assessment & Plan: Visit Diagnoses:    ICD-10-CM   1. Right hand paresthesia  R20.2 NCV with EMG (electromyography)    2. Left hand paresthesia  R20.2 NCV with EMG  (electromyography)    3. Bilateral hand pain  M79.641 NCV with EMG (electromyography)   [id number]        Plan: Impression: Clinically her symptoms are quite diverse and not specifically classic but the do correspond to a great deal with possible median neuropathy at the wrist versus polyneuropathy.  Electrodiagnostic study performed today.  The above electrodiagnostic study is ABNORMAL and reveals evidence of: a severe right median nerve entrapment at the wrist (carpal tunnel syndrome) affecting sensory and motor components.   a mild to moderate left median nerve entrapment at the wrist (carpal tunnel syndrome) affecting sensory components.  There is no significant electrodiagnostic evidence of any other focal nerve entrapment, brachial plexopathy or cervical radiculopathy.   Recommendations: 1.  Follow-up with referring physician. 2.  Continue current management of symptoms. 3.  Continue use of resting splint at night-time and as needed during the day. 4.  Suggest surgical evaluation.  Meds & Orders: No orders of the defined types were placed in this encounter.   Orders Placed This Encounter  Procedures   NCV with EMG (electromyography)    Follow-up: Return for Fuller Canada, MD.   Procedures: No procedures performed  EMG & NCV Findings: Evaluation of the right median motor nerve showed prolonged distal onset latency (4.6 ms), reduced amplitude (4.1 mV), and decreased conduction velocity (Elbow-Wrist, 47 m/s).  The left median (across palm) sensory nerve showed no response (Wrist) and no response (Palm).  The right median (across palm) sensory  nerve showed prolonged distal peak latency (Wrist, 4.8 ms).  The left ulnar sensory nerve showed reduced amplitude (5.7 V).  All remaining nerves (as indicated in the following tables) were within normal limits.  Left vs. Right side comparison data for the median motor nerve indicates abnormal L-R velocity difference (Elbow-Wrist, 12 m/s).   The ulnar motor nerve indicates abnormal L-R velocity difference (A Elbow-B Elbow, 22 m/s).  The ulnar sensory nerve indicates abnormal L-R latency difference (0.5 ms) and abnormal L-R amplitude difference (73.5 %).    Needle evaluation of the right abductor pollicis brevis muscle showed increased insertional activity, increased spontaneous activity, and diminished recruitment.  All remaining muscles (as indicated in the following table) showed no evidence of electrical instability.    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   a mild to moderate left median nerve entrapment at the wrist (carpal tunnel syndrome) affecting sensory components.  There is no significant electrodiagnostic evidence of any other focal nerve entrapment, brachial plexopathy or cervical radiculopathy.   Recommendations: 1.  Follow-up with referring physician. 2.  Continue current management of symptoms. 3.  Continue use of resting splint at night-time and as needed during the day. 4.  Suggest surgical evaluation.  ___________________________ Naaman Plummer FAAPMR Board Certified, American Board of Physical Medicine and Rehabilitation    Nerve Conduction Studies Anti Sensory Summary Table   Stim Site NR Peak (ms) Norm Peak (ms) P-T Amp (V) Norm P-T Amp Site1 Site2 Delta-P (ms) Dist (cm) Vel (m/s) Norm Vel (m/s)  Left Median Acr Palm Anti Sensory (2nd Digit)  29.3C  Wrist *NR  <3.6  >10 Wrist Palm  0.0    Palm *NR  <2.0              6.0  2.7         Right Median Acr Palm Anti Sensory (2nd Digit)  30.4C  Wrist    *4.8 <3.6 14.1 >10 Wrist Palm 3.7 0.0    Palm    1.1 <2.0 23.1         Left Radial Anti Sensory (Base 1st Digit)  30.4C  Wrist    2.2 <3.1 4.8  Wrist Base 1st Digit 2.2 0.0    Right Radial Anti Sensory (Base 1st Digit)  30.5C  Wrist    1.9  <3.1 15.5  Wrist Base 1st Digit 1.9 0.0    Left Ulnar Anti Sensory (5th Digit)  30C  Wrist    3.3 <3.7 *5.7 >15.0 Wrist 5th Digit 3.3 14.0 42 >38  Right Ulnar Anti Sensory (5th Digit)  31C  Wrist    2.8 <3.7 21.5 >15.0 Wrist 5th Digit 2.8 14.0 50 >38   Motor Summary Table   Stim Site NR Onset (ms) Norm Onset (ms) O-P Amp (mV) Norm O-P Amp Site1 Site2 Delta-0 (ms) Dist (cm) Vel (m/s) Norm Vel (m/s)  Left Median Motor (Abd Poll Brev)  30.9C  Wrist    4.1 <4.2 6.6 >5 Elbow Wrist 3.9 23.0 59 >50  Elbow    8.0  7.2         Right Median Motor (Abd Poll Brev)  31C  Wrist    *4.6 <4.2 *4.1 >5 Elbow Wrist 4.9 23.0 *47 >50  Elbow    9.5  4.2         Left Ulnar Motor (Abd Dig Min)  31.5C  Wrist    3.0 <4.2 7.2 >3 B Elbow Wrist 3.2 20.0 63 >53  B Elbow    6.2  6.5  A Elbow B Elbow 1.1 11.0 100 >53  A Elbow    7.3  7.4         Right Ulnar Motor (Abd Dig Min)  31.4C  Wrist    2.6 <4.2 7.8 >3 B Elbow Wrist 3.6 21.0 58 >53  B Elbow    6.2  7.1  A Elbow B Elbow 0.9 11.0 122 >53  A Elbow    7.1  7.3          EMG   Side Muscle Nerve Root Ins Act Fibs Psw Amp Dur Poly Recrt Int Dennie Bible Comment  Right Abd Poll Brev Median C8-T1 *Incr *3+ *3+ Nml Nml 0 *Reduced Nml   Right 1stDorInt Ulnar C8-T1 Nml Nml Nml Nml Nml 0 Nml Nml   Right PronatorTeres Median C6-7 Nml Nml Nml Nml Nml 0 Nml Nml   Right Biceps Musculocut C5-6 Nml Nml Nml Nml Nml 0 Nml Nml   Right Deltoid Axillary C5-6 Nml Nml Nml Nml Nml 0 Nml Nml     Nerve Conduction Studies Anti Sensory Left/Right Comparison   Stim Site L Lat (ms) R Lat (ms) L-R Lat (ms) L Amp (V) R Amp (V) L-R Amp (%) Site1 Site2 L Vel (m/s) R Vel (m/s) L-R Vel (m/s)  Median Acr Palm Anti Sensory (2nd Digit)  29.3C  Wrist  *4.8   14.1  Wrist Palm     Palm  1.1   23.1         6.0   2.7         Radial Anti Sensory (Base 1st Digit)  30.4C  Wrist 2.2 1.9 0.3 4.8 15.5 69.0 Wrist Base 1st Digit     Ulnar Anti Sensory (5th Digit)  30C  Wrist 3.3 2.8 *0.5 *5.7 21.5  *73.5 Wrist 5th Digit 42 50 8   Motor Left/Right Comparison   Stim Site L Lat (ms) R Lat (ms) L-R Lat (ms) L Amp (mV) R Amp (mV) L-R Amp (%) Site1 Site2 L Vel (m/s) R Vel (m/s) L-R Vel (m/s)  Median Motor (Abd Poll Brev)  30.9C  Wrist 4.1 *4.6 0.5 6.6 *4.1 37.9 Elbow Wrist 59 *47 *12  Elbow 8.0 9.5 1.5 7.2 4.2 41.7       Ulnar Motor (Abd Dig Min)  31.5C  Wrist 3.0 2.6 0.4 7.2 7.8 7.7 B Elbow Wrist 63 58 5  B Elbow 6.2 6.2 0.0 6.5 7.1 8.5 A Elbow B Elbow 100 122 *22  A Elbow 7.3 7.1 0.2 7.4 7.3 1.4          Waveforms:                     Clinical History: No specialty comments available.   She reports that she has never smoked. She has never used smokeless tobacco. No results for input(s): "HGBA1C", "LABURIC" in the last 8760 hours.  Objective:  VS:  HT:    WT:   BMI:     BP:   HR: bpm  TEMP: ( )  RESP:  Physical Exam Vitals and nursing note reviewed.  Constitutional:      General: She is not in acute distress.    Appearance: Normal appearance. She is well-developed. She is not ill-appearing.  HENT:     Head: Normocephalic and atraumatic.  Eyes:     Conjunctiva/sclera:  Conjunctivae normal.     Pupils: Pupils are equal, round, and reactive to light.  Cardiovascular:     Rate and Rhythm: Normal rate.     Pulses: Normal pulses.  Pulmonary:     Effort: Pulmonary effort is normal.  Musculoskeletal:        General: Tenderness present. No swelling or deformity.     Right lower leg: No edema.     Left lower leg: No edema.     Comments: Inspection reveals no atrophy of the bilateral APB or FDI or hand intrinsics. There is no swelling, color changes, allodynia or dystrophic changes. There is 5 out of 5 strength in the bilateral wrist extension, finger abduction and long finger flexion.  There is impaired sensation light touch in the right median nerve distribution.There is a negative Tinel's test at the bilateral wrist and elbow. There is a positive Phalen's test  bilaterally. There is a negative Hoffmann's test bilaterally.  Skin:    General: Skin is warm and dry.     Findings: No erythema or rash.  Neurological:     General: No focal deficit present.     Mental Status: She is alert and oriented to person, place, and time.     Cranial Nerves: No cranial nerve deficit.     Sensory: Sensory deficit present.     Motor: Weakness present. No abnormal muscle tone.     Coordination: Coordination normal.     Gait: Gait normal.  Psychiatric:        Mood and Affect: Mood normal.        Behavior: Behavior normal.     Ortho Exam  Imaging: No results found.  Past Medical/Family/Surgical/Social History: Medications & Allergies reviewed per EMR, new medications updated. Patient Active Problem List   Diagnosis Date Noted   Migraine headache 11/27/2006   SYMPTOM, COUGH 11/27/2006   Past Medical History:  Diagnosis Date   Scoliosis    Vertigo    Family History  Problem Relation Age of Onset   Heart attack Mother    Kidney failure Maternal Grandmother    Heart attack Maternal Grandfather    History reviewed. No pertinent surgical history. Social History   Occupational History   Not on file  Tobacco Use   Smoking status: Never   Smokeless tobacco: Never  Vaping Use   Vaping status: Never Used  Substance and Sexual Activity   Alcohol use: Never   Drug use: Never   Sexual activity: Yes    Birth control/protection: Injection

## 2023-04-03 ENCOUNTER — Telehealth: Payer: Self-pay

## 2023-04-03 NOTE — Telephone Encounter (Signed)
 Attempted wellness call/follow up with [facility] client. Her PCP on enrollment was [facility], last seen there in 07/30/22 and no future appointments. EPIC showing [employer] and upcoming appointment with [facility] 05/30/23  No answer to call, unable to leave a voicemail due to mailbox is full.  Plan: will plan to re-attempt to determine if she has insurance and establishing care with a new Primary care.   Francee Nodal RN [employer]

## 2023-04-05 ENCOUNTER — Ambulatory Visit (INDEPENDENT_AMBULATORY_CARE_PROVIDER_SITE_OTHER): Admitting: Orthopedic Surgery

## 2023-04-05 ENCOUNTER — Encounter: Payer: Self-pay | Admitting: Orthopedic Surgery

## 2023-04-05 DIAGNOSIS — M25561 Pain in right knee: Secondary | ICD-10-CM | POA: Diagnosis not present

## 2023-04-05 DIAGNOSIS — G5601 Carpal tunnel syndrome, right upper limb: Secondary | ICD-10-CM

## 2023-04-05 DIAGNOSIS — M25461 Effusion, right knee: Secondary | ICD-10-CM

## 2023-04-05 DIAGNOSIS — G8929 Other chronic pain: Secondary | ICD-10-CM

## 2023-04-05 DIAGNOSIS — Z01818 Encounter for other preprocedural examination: Secondary | ICD-10-CM

## 2023-04-05 MED ORDER — METHYLPREDNISOLONE ACETATE 40 MG/ML IJ SUSP
40.0000 mg | Freq: Once | INTRAMUSCULAR | Status: AC
  Administered 2023-04-05: 40 mg via INTRA_ARTICULAR

## 2023-04-05 NOTE — Progress Notes (Signed)
   There were no vitals taken for this visit.  There is no height or weight on file to calculate BMI.  Chief Complaint  Patient presents with   Hand Pain    Both, right is worse     No diagnosis found.  DOI/DOS/ Date: here to review nerve study   Worse

## 2023-04-05 NOTE — Progress Notes (Signed)
 Chief Complaint  Patient presents with   Hand Pain    Both, right is worse    Results    Review upper ext nerve study   02/28/23 :   48 year old female unclear etiology of bilateral symptoms in her hand to include intermittent weakness intermittent pain some numbness and tingling at times loss of function such as simple activities such as holding a cup or brushing her teeth recommend nerve conduction study as there is no clear etiology of this constellation of symptoms.  Does not want to steroid , so she is on ibuprofen we will continue that     Subjective:     Chief Complaint  Patient presents with   Hand Pain      Bilateral       HPI: 48 year old female presents with bilateral hand pain loss of function weakness loss of dexterity some paresthesias type symptoms currently left side is symptomatic right side is not pain over the volar aspect of the wrist and into the forearm does not go above the elbow                                                                     ROS: Scoliosis, knee pain, comes in in a wheelchair because of knee symptoms and back pain      Ortho Exam Both hands look normal she can make a full fist on the right cannot make 1 on the left the right side is asymptomatic   On the left side the lateral epicondyle is nontender.  The forearm is tender in the soft tissues of the volar side she has decreased flexion strength on the left side and some sensory loss of the fingertips of the long and ring finger     Several things  Assessment and plan  This patient has headaches and vertigo she has been on some medication but it was stopped.  She has never tried meclizine for vertigo.  She is try to get several appointments to primary care unable to do so.  She is scheduled to see Dr. Allena Katz in May.  She has this unusual right knee effusion warmth to the joint painful extension keeps the knee pretty much in flexion at 90 degrees ambulates in a wheelchair husband  has to pick her up to move her  History of scoliosis no back pain no radicular symptoms  Knee feels stable in anterior posterior drawer test  I did do an aspiration injection got some bloody fluid out send it for analysis only about 5 cc  Plan  #1 scheduled for right carpal tunnel release/probable 1 year recovery  #2 aspiration injection right knee.  Brace not tolerated.  PT in preparation for MRI   Procedures  Procedure note injection and aspiration right knee joint  Verbal consent was obtained to aspirate and inject the right knee joint   Timeout was completed to confirm the site of aspiration and injection  An 18-gauge needle was used to aspirate the knee joint from a suprapatellar lateral approach.  The medications used were 40 mg of Depo-Medrol and 1% lidocaine 3 cc  Anesthesia was provided by ethyl chloride and the skin was prepped with alcohol.  After cleaning the skin with alcohol an 18-gauge needle  was used to aspirate the right knee joint.  We obtained 5  cc of fluid tinged  We follow this by injection of 40 mg of Depo-Medrol and 3 cc 1% lidocaine.  There were no complications. A sterile bandage was applied.

## 2023-04-05 NOTE — H&P (View-Only) (Signed)
 Chief Complaint  Patient presents with   Hand Pain    Both, right is worse    Results    Review upper ext nerve study   02/28/23 :   48 year old female unclear etiology of bilateral symptoms in her hand to include intermittent weakness intermittent pain some numbness and tingling at times loss of function such as simple activities such as holding a cup or brushing her teeth recommend nerve conduction study as there is no clear etiology of this constellation of symptoms.  Does not want to steroid , so she is on ibuprofen we will continue that     Subjective:     Chief Complaint  Patient presents with   Hand Pain      Bilateral       HPI: 48 year old female presents with bilateral hand pain loss of function weakness loss of dexterity some paresthesias type symptoms currently left side is symptomatic right side is not pain over the volar aspect of the wrist and into the forearm does not go above the elbow                                                                     ROS: Scoliosis, knee pain, comes in in a wheelchair because of knee symptoms and back pain      Ortho Exam Both hands look normal she can make a full fist on the right cannot make 1 on the left the right side is asymptomatic   On the left side the lateral epicondyle is nontender.  The forearm is tender in the soft tissues of the volar side she has decreased flexion strength on the left side and some sensory loss of the fingertips of the long and ring finger     Several things  Assessment and plan  This patient has headaches and vertigo she has been on some medication but it was stopped.  She has never tried meclizine for vertigo.  She is try to get several appointments to primary care unable to do so.  She is scheduled to see Dr. Allena Katz in May.  She has this unusual right knee effusion warmth to the joint painful extension keeps the knee pretty much in flexion at 90 degrees ambulates in a wheelchair husband  has to pick her up to move her  History of scoliosis no back pain no radicular symptoms  Knee feels stable in anterior posterior drawer test  I did do an aspiration injection got some bloody fluid out send it for analysis only about 5 cc  Plan  #1 scheduled for right carpal tunnel release/probable 1 year recovery  #2 aspiration injection right knee.  Brace not tolerated.  PT in preparation for MRI   Procedures  Procedure note injection and aspiration right knee joint  Verbal consent was obtained to aspirate and inject the right knee joint   Timeout was completed to confirm the site of aspiration and injection  An 18-gauge needle was used to aspirate the knee joint from a suprapatellar lateral approach.  The medications used were 40 mg of Depo-Medrol and 1% lidocaine 3 cc  Anesthesia was provided by ethyl chloride and the skin was prepped with alcohol.  After cleaning the skin with alcohol an 18-gauge needle  was used to aspirate the right knee joint.  We obtained 5  cc of fluid tinged  We follow this by injection of 40 mg of Depo-Medrol and 3 cc 1% lidocaine.  There were no complications. A sterile bandage was applied.

## 2023-04-05 NOTE — Patient Instructions (Addendum)
 Physical therapy has been ordered for you at [facility]. They should call you to schedule, [phone] is the phone number to call, if you want to call to schedule.  Your surgery will be at [facility] by Dr Romeo Apple  The hospital will contact you with a preoperative appointment to discuss Anesthesia.  Please arrive on time or 15 minutes early for the preoperative appointment, they have a very tight schedule if you are late or do not come in your surgery will be cancelled.  The phone number is [phone]. Please bring your medications with you for the appointment. They will tell you the arrival time and medication instructions when you have your preoperative evaluation. Do not wear nail polish the day of your surgery and if you take Phentermine you need to stop this medication ONE WEEK prior to your surgery. If you take Docia Barrier, Jardiance, or Steglatro) - Hold 72 hours before the procedure.  If you take Ozempic,  Mounjaro, Bydureon or Trulicity do not take for 8 days before your surgery. If you take Victoza, Rybelsis, Saxenda or Adlyxi stop 24 hours before the procedure.  Please arrive at the hospital 2 hours before procedure if scheduled at 9:30 or later in the day or at the time the nurse tells you at your preoperative visit.   If you have my chart do not use the time given in my chart use the time given to you by the nurse during your preoperative visit.   Your surgery  time may change. Please be available for phone calls the day of your surgery and the day before. The Short Stay department may need to discuss changes about your surgery time. Not reaching the you could lead to procedure delays and possible cancellation.  You must have a ride home and someone to stay with you for 24 to 48 hours. The person taking you home will receive and sign for the your discharge instructions.  Please be prepared to give your support person's name and telephone number to Central Registration. Dr  Romeo Apple will need that name and phone number post procedure.

## 2023-04-05 NOTE — Addendum Note (Signed)
 Addended byCaffie Damme on: 04/05/2023 11:03 AM   Modules accepted: Orders

## 2023-04-05 NOTE — Addendum Note (Signed)
 Addended byCaffie Damme on: 04/05/2023 10:52 AM   Modules accepted: Orders

## 2023-04-09 NOTE — Patient Instructions (Signed)
 Brittany Hood  04/09/2023     @PREFPERIOPPHARMACY @   Your procedure is scheduled on  04/16/2023.   Report to [facility] at  1010  A.M.   Call this number if you have problems the morning of surgery:  [phone]  If you experience any cold or flu symptoms such as cough, fever, chills, shortness of breath, etc. between now and your scheduled surgery, please notify us at the above number.   Remember:  Do not eat after midnight.   You may drink clear liquids until  0810  am on 04/16/2023.    Clear liquids allowed are:                    Water, Juice (No red color; non-citric and without pulp; diabetics please choose diet or no sugar options), Carbonated beverages (diabetics please choose diet or no sugar options), Clear Tea (No creamer, milk, or cream, including half & half and powdered creamer), Black Coffee Only (No creamer, milk or cream, including half & half and powdered creamer), and Clear Sports drink (No red color; diabetics please choose diet or no sugar options)        At 0810 am on 04/16/2023 drink your carb drink. You can have nothing else to drink after this.    Take these medicines the morning of surgery with A SIP OF WATER                                                  None.    Do not wear jewelry, make-up or nail polish, including gel polish,  artificial nails, or any other type of covering on natural nails (fingers and  toes).  Do not wear lotions, powders, or perfumes, or deodorant.  Do not shave 48 hours prior to surgery.  Men may shave face and neck.  Do not bring valuables to the hospital.  [facility] is not responsible for any belongings or valuables.  Contacts, dentures or bridgework may not be worn into surgery.  Leave your suitcase in the car.  After surgery it may be brought to your room.  For patients admitted to the hospital, discharge time will be determined by your treatment team.  Patients discharged the day of surgery will  not be allowed to drive home and must have someone with them for 24 hours.    Special instructions:   DO NOT smoke tobacco or vape for 24 hours before your procedure.  Please read over the following fact sheets that you were given. Coughing and Deep Breathing, Surgical Site Infection Prevention, Anesthesia Post-op Instructions, and Care and Recovery After Surgery      Open Carpal Tunnel Release: What to Expect Open carpal tunnel release is a surgery to relieve symptoms caused by carpal tunnel syndrome (CTS). CTS causes swelling in a narrow space in your wrist. The swelling pinches the median nerve, causing pain, numbness, and weakness in your hand. You may need this surgery if other treatments haven't helped your symptoms. The surgery involves cutting a ligament in your wrist to relieve pressure on the median nerve. Tell a health care provider about: Any allergies you have. All medicines you take. These include vitamins, herbs, eye drops, and creams. Any problems you or family members have had with anesthesia. Any bleeding problems you  have. Any surgeries you've had. Any medical problems you have. Whether you're pregnant or may be pregnant. What are the risks? Your health care provider will talk with you about risks. These may include: Infection. Bleeding. Allergic reactions to medicines. Damage to the nerve, a blood vessel, or other nearby structures. Failed treatment. The surgery fails to treat your symptoms or make your symptoms worse. Long-term weakness of the hand. What happens before? When to stop eating and drinking Eat and drink only as you've been told. You may be told this: 8 hours before your surgery Stop eating most foods. Do not eat meat, fried foods, or fatty foods. Eat only light foods, such as toast or crackers. All liquids are OK except energy drinks and alcohol. 6 hours before your surgery Stop eating. Drink only clear liquids, such as water, clear fruit  juice, black coffee, plain tea, and sports drinks. Do not drink energy drinks or alcohol. 2 hours before your surgery Stop drinking all liquids. You may be allowed to take medicines with small sips of water. If you do not eat and drink as told, your surgery may be delayed or canceled. Medicines Ask about changing or stopping: Any medicines you take. Any vitamins, herbs, or supplements you take. Do not take aspirin or ibuprofen unless you're told to. Surgery safety For your safety, you may: Need to wash your skin with a soap that kills germs. Get antibiotics. Have your surgery site marked. Have hair removed at the surgery site. General instructions Ask if you'll be staying overnight in the hospital. If you'll be going home right after the surgery, plan to have a responsible adult: Drive you home from the hospital or clinic. You won't be allowed to drive. Stay with you for the time you're told. Do not smoke, vape, or use nicotine or tobacco for at least 4 weeks before the surgery. What happens during open carpal tunnel release?  An IV will be put into a vein in your hand or arm. You may be given: A sedative to help you relax. Anesthesia to keep you from feeling pain. A cut will be made in your wrist, on the same side as your palm. The skin of your wrist will be spread to expose the transverse carpal ligament. The ligament will be cut to make more room in the carpal tunnel space. Your cut will be closed with stitches or staples. A compression bandage will be wrapped around your hand and wrist. These steps may vary. Ask what you can expect. What happens after? You'll be watched closely until you leave. This includes checking your pain level, blood pressure, heart rate, and breathing rate. You'll be given pain medicine as needed. A splint or brace may be placed over your bandage. This will hold your hand and wrist in place while you heal. This information is not intended to replace  advice given to you by your health care provider. Make sure you discuss any questions you have with your health care provider. Document Revised: 10/15/2022 Document Reviewed: 10/15/2022 Elsevier Patient Education  2024 [employer].General Anesthesia, Adult, Care After The following information offers guidance on how to care for yourself after your procedure. Your health care provider may also give you more specific instructions. If you have problems or questions, contact your health care provider. What can I expect after the procedure? After the procedure, it is common for people to: Have pain or discomfort at the IV site. Have nausea or vomiting. Have a sore throat or hoarseness.  Have trouble concentrating. Feel cold or chills. Feel weak, sleepy, or tired (fatigue). Have soreness and body aches. These can affect parts of the body that were not involved in surgery. Follow these instructions at home: For the time period you were told by your health care provider:  Rest. Do not participate in activities where you could fall or become injured. Do not drive or use machinery. Do not drink alcohol. Do not take sleeping pills or medicines that cause drowsiness. Do not make important decisions or sign legal documents. Do not take care of children on your own. General instructions Drink enough fluid to keep your urine pale yellow. If you have sleep apnea, surgery and certain medicines can increase your risk for breathing problems. Follow instructions from your health care provider about wearing your sleep device: Anytime you are sleeping, including during daytime naps. While taking prescription pain medicines, sleeping medicines, or medicines that make you drowsy. Return to your normal activities as told by your health care provider. Ask your health care provider what activities are safe for you. Take over-the-counter and prescription medicines only as told by your health care provider. Do not  use any products that contain nicotine or tobacco. These products include cigarettes, chewing tobacco, and vaping devices, such as e-cigarettes. These can delay incision healing after surgery. If you need help quitting, ask your health care provider. Contact a health care provider if: You have nausea or vomiting that does not get better with medicine. You vomit every time you eat or drink. You have pain that does not get better with medicine. You cannot urinate or have bloody urine. You develop a skin rash. You have a fever. Get help right away if: You have trouble breathing. You have chest pain. You vomit blood. These symptoms may be an emergency. Get help right away. Call 911. Do not wait to see if the symptoms will go away. Do not drive yourself to the hospital. Summary After the procedure, it is common to have a sore throat, hoarseness, nausea, vomiting, or to feel weak, sleepy, or fatigue. For the time period you were told by your health care provider, do not drive or use machinery. Get help right away if you have difficulty breathing, have chest pain, or vomit blood. These symptoms may be an emergency. This information is not intended to replace advice given to you by your health care provider. Make sure you discuss any questions you have with your health care provider. Document Revised: 04/14/2021 Document Reviewed: 04/14/2021 Elsevier Patient Education  2024 Else[employer] Inc.How to Use Chlorhexidine at Home in the Shower Chlorhexidine gluconate (CHG) is a germ-killing (antiseptic) wash that's used to clean the skin. It can get rid of the germs that normally live on the skin and can keep them away for about 24 hours. If you're having surgery, you may be told to shower with CHG at home the night before surgery. This can help lower your risk for infection. To use CHG wash in the shower, follow the steps below. Supplies needed: CHG body wash. Clean washcloth. Clean towel. How to use CHG  in the shower Follow these steps unless you're told to use CHG in a different way: Start the shower. Use your normal soap and shampoo to wash your face and hair. Turn off the shower or move out of the shower stream. Pour CHG onto a clean washcloth. Do not use any type of brush or rough sponge. Start at your neck, washing your body down to your  toes. Make sure you: Wash the part of your body where the surgery will be done for at least 1 minute. Do not scrub. Do not use CHG on your head or face unless your health care provider tells you to. If it gets into your ears or eyes, rinse them well with water. Do not wash your genitals with CHG. Wash your back and under your arms. Make sure to wash skin folds. Let the CHG sit on your skin for 1-2 minutes or as long as told. Rinse your entire body in the shower, including all body creases and folds. Turn off the shower. Dry off with a clean towel. Do not put anything on your skin afterward, such as powder, lotion, or perfume. Put on clean clothes or pajamas. If it's the night before surgery, sleep in clean sheets. General tips Use CHG only as told, and follow the instructions on the label. Use the full amount of CHG as told. This is often one bottle. Do not smoke and stay away from flames after using CHG. Your skin may feel sticky after using CHG. This is normal. The sticky feeling will go away as the CHG dries. Do not use CHG: If you have a chlorhexidine allergy or have reacted to chlorhexidine in the past. On open wounds or areas of skin that have broken skin, cuts, or scrapes. On babies younger than 60 months of age. Contact a health care provider if: You have questions about using CHG. Your skin gets irritated or itchy. You have a rash after using CHG. You swallow any CHG. Call your local poison control center [phone] in the [place] Your eyes itch badly, or they become very red or swollen. Your hearing changes. You have trouble  seeing. If you can't reach your provider, go to an urgent care or emergency room. Do not drive yourself. Get help right away if: You have swelling or tingling in your mouth or throat. You make high-pitched whistling sounds when you breathe, most often when you breathe out (wheeze). You have trouble breathing. These symptoms may be an emergency. Call 911 right away. Do not wait to see if the symptoms will go away. Do not drive yourself to the hospital. This information is not intended to replace advice given to you by your health care provider. Make sure you discuss any questions you have with your health care provider. Document Revised: 07/31/2022 Document Reviewed: 07/27/2021 Elsevier Patient Education  2024 [employer].

## 2023-04-10 ENCOUNTER — Ambulatory Visit (HOSPITAL_COMMUNITY)

## 2023-04-10 LAB — SYNOVIAL FLUID ANALYSIS, COMPLETE
Basophils, %: 0 %
Eosinophils-Synovial: 0 % (ref 0–2)
Lymphocytes-Synovial Fld: 27 % (ref 0–74)
Monocyte/Macrophage: 13 % (ref 0–69)
Neutrophil, Synovial: 60 % — ABNORMAL HIGH (ref 0–24)
Synoviocytes, %: 0 % (ref 0–15)
WBC, Synovial: 34530 {cells}/uL — ABNORMAL HIGH (ref <150)

## 2023-04-10 LAB — WOUND CULTURE
MICRO NUMBER:: 16174425
RESULT:: NO GROWTH
SPECIMEN QUALITY:: ADEQUATE

## 2023-04-11 ENCOUNTER — Encounter (HOSPITAL_COMMUNITY): Payer: Self-pay

## 2023-04-11 ENCOUNTER — Encounter (HOSPITAL_COMMUNITY)
Admission: RE | Admit: 2023-04-11 | Discharge: 2023-04-11 | Disposition: A | Discharge status: Home / Self Care | Payer: Self-pay | Source: Ambulatory Visit | Attending: Orthopedic Surgery | Admitting: Orthopedic Surgery

## 2023-04-11 ENCOUNTER — Other Ambulatory Visit: Payer: Self-pay

## 2023-04-11 VITALS — BP 111/84 | HR 83 | Temp 98.1°F | Resp 18 | Ht 62.0 in | Wt 186.1 lb

## 2023-04-11 DIAGNOSIS — G5601 Carpal tunnel syndrome, right upper limb: Secondary | ICD-10-CM | POA: Insufficient documentation

## 2023-04-11 DIAGNOSIS — Z01818 Encounter for other preprocedural examination: Secondary | ICD-10-CM | POA: Diagnosis not present

## 2023-04-11 DIAGNOSIS — R638 Other symptoms and signs concerning food and fluid intake: Secondary | ICD-10-CM | POA: Insufficient documentation

## 2023-04-11 HISTORY — DX: Depression, unspecified: F32.A

## 2023-04-11 HISTORY — DX: Anxiety disorder, unspecified: F41.9

## 2023-04-11 HISTORY — DX: Anemia, unspecified: D64.9

## 2023-04-11 LAB — CBC WITH DIFFERENTIAL/PLATELET
Abs Immature Granulocytes: 0.07 10*3/uL (ref 0.00–0.07)
Basophils Absolute: 0.1 10*3/uL (ref 0.0–0.1)
Basophils Relative: 1 %
Eosinophils Absolute: 0.2 10*3/uL (ref 0.0–0.5)
Eosinophils Relative: 2 %
HCT: 35 % — ABNORMAL LOW (ref 36.0–46.0)
Hemoglobin: 11 g/dL — ABNORMAL LOW (ref 12.0–15.0)
Immature Granulocytes: 1 %
Lymphocytes Relative: 26 %
Lymphs Abs: 2.4 10*3/uL (ref 0.7–4.0)
MCH: 26.8 pg (ref 26.0–34.0)
MCHC: 31.4 g/dL (ref 30.0–36.0)
MCV: 85.4 fL (ref 80.0–100.0)
Monocytes Absolute: 0.8 10*3/uL (ref 0.1–1.0)
Monocytes Relative: 8 %
Neutro Abs: 5.7 10*3/uL (ref 1.7–7.7)
Neutrophils Relative %: 62 %
Platelets: 597 10*3/uL — ABNORMAL HIGH (ref 150–400)
RBC: 4.1 MIL/uL (ref 3.87–5.11)
RDW: 14.9 % (ref 11.5–15.5)
WBC: 9.2 10*3/uL (ref 4.0–10.5)
nRBC: 0 % (ref 0.0–0.2)

## 2023-04-11 LAB — BASIC METABOLIC PANEL
Anion gap: 11 (ref 5–15)
BUN: 14 mg/dL (ref 6–20)
CO2: 20 mmol/L — ABNORMAL LOW (ref 22–32)
Calcium: 9.1 mg/dL (ref 8.9–10.3)
Chloride: 104 mmol/L (ref 98–111)
Creatinine, Ser: 0.72 mg/dL (ref 0.44–1.00)
GFR, Estimated: >60 mL/min (ref >60)
Glucose, Bld: 89 mg/dL (ref 70–99)
Potassium: 4.4 mmol/L (ref 3.5–5.1)
Sodium: 135 mmol/L (ref 135–145)

## 2023-04-11 LAB — POCT PREGNANCY, URINE: Preg Test, Ur: NEGATIVE

## 2023-04-15 NOTE — H&P (Signed)
 Brittany Hood is an 48 y.o. female.   Chief Complaint: Pain and paresthesias left and right upper extremity HPI:  As noted below she is a 48 year old female with worsening pain and paresthesias of both upper extremities and some notable loss of dexterity  She has positive EMG nerve conduction studies EMG & NCV Findings: Evaluation of the right median motor nerve showed prolonged distal onset latency (4.6 ms), reduced amplitude (4.1 mV), and decreased conduction velocity (Elbow-Wrist, 47 m/s).  The left median (across palm) sensory nerve showed no response (Wrist) and no response (Palm).  The right median (across palm) sensory nerve showed prolonged distal peak latency (Wrist, 4.8 ms).  The left ulnar sensory nerve showed reduced amplitude (5.7 V).  All remaining nerves (as indicated in the following tables) were within normal limits.  Left vs. Right side comparison data for the median motor nerve indicates abnormal L-R velocity difference (Elbow-Wrist, 12 m/s).  The ulnar motor nerve indicates abnormal L-R velocity difference (A Elbow-B Elbow, 22 m/s).  The ulnar sensory nerve indicates abnormal L-R latency difference (0.5 ms) and abnormal L-R amplitude difference (73.5 %).     Needle evaluation of the right abductor pollicis brevis muscle showed increased insertional activity, increased spontaneous activity, and diminished recruitment.  All remaining muscles (as indicated in the following table) showed no evidence of electrical instability.     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   a mild to moderate left median nerve entrapment at the wrist (carpal tunnel syndrome) affecting sensory components.   There is no significant electrodiagnostic evidence of any other focal nerve entrapment, brachial plexopathy or cervical radiculopathy.     Recommendations: 1.  Follow-up with referring physician. 2.  Continue current management of symptoms. 3.  Continue use of resting splint at night-time and as needed during the day. 4.  Suggest surgical evaluation.   ___________________________ Naaman Plummer FAAPMR Board Certified, American Board of Physical Medicine and Rehabilitation      Nerve Conduction Studies Anti Sensory Summary Table    Stim Site NR Peak (ms) Norm Peak (ms) P-T Amp (V) Norm P-T Amp Site1 Site2 Delta-P (ms) Dist (cm) Vel (m/s) Norm Vel (m/s)  Left Median Acr Palm Anti Sensory (2nd Digit)  29.3C  Wrist *NR   <3.6   >10 Wrist Palm   0.0      Palm *NR   <2.0                       6.0   2.7                Right Median Acr Palm Anti Sensory (2nd Digit)  30.4C  Wrist    *4.8 <3.6 14.1 >10 Wrist Palm 3.7 0.0      Palm    1.1 <2.0 23.1                Left Radial Anti Sensory (Base 1st Digit)  30.4C  Wrist    2.2 <3.1 4.8   Wrist Base 1st Digit 2.2 0.0      Right Radial Anti Sensory (Base 1st Digit)  30.5C  Wrist    1.9 <3.1 15.5   Wrist Base 1st Digit 1.9 0.0      Left Ulnar Anti Sensory (5th Digit)  30C  Wrist    3.3 <3.7 *5.7 >15.0 Wrist 5th Digit  3.3 14.0 42 >38  Right Ulnar Anti Sensory (5th Digit)  31C  Wrist    2.8 <3.7 21.5 >15.0 Wrist 5th Digit 2.8 14.0 50 >38    Motor Summary Table    Stim Site NR Onset (ms) Norm Onset (ms) O-P Amp (mV) Norm O-P Amp Site1 Site2 Delta-0 (ms) Dist (cm) Vel (m/s) Norm Vel (m/s)  Left Median Motor (Abd Poll Brev)  30.9C  Wrist    4.1 <4.2 6.6 >5 Elbow Wrist 3.9 23.0 59 >50  Elbow    8.0   7.2                Right Median Motor (Abd Poll Brev)  31C  Wrist    *4.6 <4.2 *4.1 >5 Elbow Wrist 4.9 23.0 *47 >50  Elbow    9.5   4.2                Left Ulnar Motor (Abd Dig Min)  31.5C  Wrist    3.0 <4.2 7.2 >3 B Elbow Wrist 3.2 20.0 63 >53  B Elbow    6.2   6.5   A Elbow B Elbow 1.1 11.0 100 >53  A Elbow    7.3   7.4                Right Ulnar Motor (Abd Dig Min)   31.4C  Wrist    2.6 <4.2 7.8 >3 B Elbow Wrist 3.6 21.0 58 >53  B Elbow    6.2   7.1   A Elbow B Elbow 0.9 11.0 122 >53  A Elbow    7.1   7.3                  EMG    Side Muscle Nerve Root Ins Act Fibs Psw Amp Dur Poly Recrt Int Dennie Bible Comment  Right Abd Poll Brev Median C8-T1 *Incr *3+ *3+ Nml Nml 0 *Reduced Nml    Right 1stDorInt Ulnar C8-T1 Nml Nml Nml Nml Nml 0 Nml Nml    Right PronatorTeres Median C6-7 Nml Nml Nml Nml Nml 0 Nml Nml    Right Biceps Musculocut C5-6 Nml Nml Nml Nml Nml 0 Nml Nml    Right Deltoid Axillary C5-6 Nml Nml Nml Nml Nml 0 Nml Nml        Nerve Conduction Studies Anti Sensory Left/Right Comparison    Stim Site L Lat (ms) R Lat (ms) L-R Lat (ms) L Amp (V) R Amp (V) L-R Amp (%) Site1 Site2 L Vel (m/s) R Vel (m/s) L-R Vel (m/s)  Median Acr Palm Anti Sensory (2nd Digit)  29.3C  Wrist   *4.8     14.1   Wrist Palm        Palm   1.1     23.1                6.0     2.7                Radial Anti Sensory (Base 1st Digit)  30.4C  Wrist 2.2 1.9 0.3 4.8 15.5 69.0 Wrist Base 1st Digit        Ulnar Anti Sensory (5th Digit)  30C  Wrist 3.3 2.8 *0.5 *5.7 21.5 *73.5 Wrist 5th Digit 42 50 8    Motor Left/Right Comparison    Stim Site L Lat (ms) R Lat (ms) L-R Lat (ms) L Amp (mV) R Amp (mV) L-R Amp (%) Site1 Site2 L Vel (m/s) R  Vel (m/s) L-R Vel (m/s)  Median Motor (Abd Poll Brev)  30.9C  Wrist 4.1 *4.6 0.5 6.6 *4.1 37.9 Elbow Wrist 59 *47 *12  Elbow 8.0 9.5 1.5 7.2 4.2 41.7            Ulnar Motor (Abd Dig Min)  31.5C  Wrist 3.0 2.6 0.4 7.2 7.8 7.7 B Elbow Wrist 63 58 5  B Elbow 6.2 6.2 0.0 6.5 7.1 8.5 A Elbow B Elbow 100 122 *22  A Elbow 7.3 7.1 0.2 7.4 7.3 1.4                          Symptoms reported at time of nerve study chronic, worsening and severe pain, numbness and tingling in the Bilateral upper extremities. Patient is Right hand dominant. She reports chronic now progressive pain numbness and tingling somewhat globally in both hands fairly equal but  more right than left. She gets symptoms that can occur at random times but she does report some nocturnal complaints more than others. Worsening symptoms with activity and using her hands. She has noted weakness and loss of dexterity. She does have a history of some neck pain but not really frank radicular symptoms down the arms. No history of diabetes or thyroid disease. She does carry a diagnosis of migraine headache. She has been using anti-inflammatory such as Aleve with some relief but not much. She has not had any electrodiagnostic studies or other workup. No other neurologic complaints. No advanced imaging of the spine but prior cervical x-ray from 2021 showed some mid thoracic scoliosis which was mild.  Past Medical History:  Diagnosis Date   Anemia    Anxiety    Depression    Scoliosis    Vertigo     Past Surgical History:  Procedure Laterality Date   CESAREAN SECTION     x2   DIAGNOSTIC LAPAROSCOPY     TUBAL LIGATION      Family History  Problem Relation Age of Onset   Heart attack Mother    Kidney failure Maternal Grandmother    Heart attack Maternal Grandfather    Social History:  reports that she has never smoked. She has never used smokeless tobacco. She reports that she does not drink alcohol and does not use drugs.  Allergies:  Allergies  Allergen Reactions   Penicillins     No medications prior to admission.    No results found for this or any previous visit (from the past 48 hours). No results found.  Review of Systems unexplained weakness  Right knee pain    Last menstrual period 03/30/2023. Physical Exam   Overall appearance normal  Orientation person place and time normal  Mood affect pleasant normal  Gait patient having trouble walking having proximal weakness and pain in the right knee  Right upper extremity on inspection shows no gross atrophy she has full range of motion of the hand the wrist is stable the strength is notable only weak  in the right skin is intact  She has tenderness over the carpal tunnel positive compression test decreased sensation in median nerve distribution with normal color and capillary refill no evidence of lymphadenopathy coordination of the hand is poor balance is also poor in the lower extremities.  No pathologic reflexes detected  Assessment/Plan Right and left carpal tunnel syndrome  Plan for right carpal tunnel release via open technique  Fuller Canada, MD 04/15/2023, 12:54 PM

## 2023-04-16 ENCOUNTER — Ambulatory Visit (HOSPITAL_COMMUNITY): Admitting: Certified Registered"

## 2023-04-16 ENCOUNTER — Encounter (HOSPITAL_COMMUNITY): Payer: Self-pay | Admitting: Orthopedic Surgery

## 2023-04-16 ENCOUNTER — Ambulatory Visit (HOSPITAL_COMMUNITY)
Admission: RE | Admit: 2023-04-16 | Discharge: 2023-04-16 | Disposition: A | Discharge status: Home / Self Care | Payer: Self-pay | Attending: Orthopedic Surgery | Admitting: Orthopedic Surgery

## 2023-04-16 ENCOUNTER — Encounter (HOSPITAL_COMMUNITY)
Admission: RE | Disposition: A | Discharge status: Home / Self Care | Payer: Self-pay | Source: Home / Self Care | Attending: Orthopedic Surgery

## 2023-04-16 ENCOUNTER — Ambulatory Visit (HOSPITAL_BASED_OUTPATIENT_CLINIC_OR_DEPARTMENT_OTHER): Admitting: Certified Registered"

## 2023-04-16 DIAGNOSIS — M25461 Effusion, right knee: Secondary | ICD-10-CM | POA: Insufficient documentation

## 2023-04-16 DIAGNOSIS — M419 Scoliosis, unspecified: Secondary | ICD-10-CM | POA: Insufficient documentation

## 2023-04-16 DIAGNOSIS — G5601 Carpal tunnel syndrome, right upper limb: Secondary | ICD-10-CM

## 2023-04-16 DIAGNOSIS — R42 Dizziness and giddiness: Secondary | ICD-10-CM | POA: Diagnosis not present

## 2023-04-16 HISTORY — PX: CARPAL TUNNEL RELEASE: SHX101

## 2023-04-16 SURGERY — CARPAL TUNNEL RELEASE
Anesthesia: General | Site: Hand | Laterality: Right

## 2023-04-16 MED ORDER — ACETAMINOPHEN-CODEINE 300-30 MG PO TABS: 1.0000 | ORAL_TABLET | Freq: Four times a day (QID) | ORAL | 0 refills | Status: AC | PRN

## 2023-04-16 MED ORDER — PROPOFOL 500 MG/50ML IV EMUL
INTRAVENOUS | Status: DC | PRN
  Administered 2023-04-16: 50 ug/kg/min via INTRAVENOUS
  Administered 2023-04-16: 30 mg via INTRAVENOUS
  Administered 2023-04-16: 20 mg via INTRAVENOUS

## 2023-04-16 MED ORDER — ACETAMINOPHEN 325 MG PO TABS
ORAL_TABLET | ORAL | Status: AC
  Filled 2023-04-16: qty 3

## 2023-04-16 MED ORDER — MIDAZOLAM HCL 2 MG/2ML IJ SOLN
INTRAMUSCULAR | Status: AC
  Filled 2023-04-16: qty 2

## 2023-04-16 MED ORDER — CEFAZOLIN SODIUM-DEXTROSE 2-4 GM/100ML-% IV SOLN
2.0000 g | INTRAVENOUS | Status: AC
  Administered 2023-04-16: 2 g via INTRAVENOUS
  Filled 2023-04-16: qty 100

## 2023-04-16 MED ORDER — FENTANYL CITRATE PF 50 MCG/ML IJ SOSY
PREFILLED_SYRINGE | INTRAMUSCULAR | Status: AC
  Filled 2023-04-16: qty 1

## 2023-04-16 MED ORDER — LIDOCAINE HCL (PF) 2 % IJ SOLN
INTRAMUSCULAR | Status: DC | PRN
  Administered 2023-04-16: 60 mg via INTRADERMAL

## 2023-04-16 MED ORDER — ACETAMINOPHEN 160 MG/5ML PO SOLN
960.0000 mg | Freq: Once | ORAL | Status: AC
  Filled 2023-04-16: qty 30

## 2023-04-16 MED ORDER — BUPIVACAINE HCL (PF) 0.5 % IJ SOLN
INTRAMUSCULAR | Status: AC
  Filled 2023-04-16: qty 30

## 2023-04-16 MED ORDER — ACETAMINOPHEN 500 MG PO TABS
1000.0000 mg | ORAL_TABLET | Freq: Once | ORAL | Status: AC
  Administered 2023-04-16: 975 mg via ORAL

## 2023-04-16 MED ORDER — ORAL CARE MOUTH RINSE: 15.0000 mL | Freq: Once | OROMUCOSAL | Status: DC

## 2023-04-16 MED ORDER — LACTATED RINGERS IV SOLN: INTRAVENOUS | Status: DC

## 2023-04-16 MED ORDER — LIDOCAINE HCL (PF) 1 % IJ SOLN
INTRAMUSCULAR | Status: AC
Start: 2023-04-16 — End: ?
  Filled 2023-04-16: qty 30

## 2023-04-16 MED ORDER — SODIUM CHLORIDE 0.9 % IV SOLN: 12.5000 mg | INTRAVENOUS | Status: DC | PRN

## 2023-04-16 MED ORDER — LIDOCAINE HCL (PF) 1 % IJ SOLN
INTRAMUSCULAR | Status: DC | PRN
  Administered 2023-04-16: 10 mL

## 2023-04-16 MED ORDER — HYDROCODONE-ACETAMINOPHEN 7.5-325 MG PO TABS: 1.0000 | ORAL_TABLET | Freq: Once | ORAL | Status: DC | PRN

## 2023-04-16 MED ORDER — MIDAZOLAM HCL 2 MG/2ML IJ SOLN
INTRAMUSCULAR | Status: DC | PRN
  Administered 2023-04-16: 2 mg via INTRAVENOUS

## 2023-04-16 MED ORDER — BUPIVACAINE HCL (PF) 0.5 % IJ SOLN
INTRAMUSCULAR | Status: DC | PRN
Start: 2023-04-16 — End: 2023-04-16
  Administered 2023-04-16: 10 mL

## 2023-04-16 MED ORDER — DEXMEDETOMIDINE HCL IN NACL 80 MCG/20ML IV SOLN
INTRAVENOUS | Status: DC | PRN
  Administered 2023-04-16: 8 ug via INTRAVENOUS

## 2023-04-16 MED ORDER — PROPOFOL 10 MG/ML IV BOLUS
INTRAVENOUS | Status: AC
  Filled 2023-04-16: qty 20

## 2023-04-16 MED ORDER — FENTANYL CITRATE PF 50 MCG/ML IJ SOSY
25.0000 ug | PREFILLED_SYRINGE | INTRAMUSCULAR | Status: DC | PRN
  Administered 2023-04-16: 50 ug via INTRAVENOUS

## 2023-04-16 MED ORDER — ONDANSETRON HCL 4 MG/2ML IJ SOLN
INTRAMUSCULAR | Status: DC | PRN
  Administered 2023-04-16: 4 mg via INTRAVENOUS

## 2023-04-16 MED ORDER — 0.9 % SODIUM CHLORIDE (POUR BTL) OPTIME
TOPICAL | Status: DC | PRN
  Administered 2023-04-16: 1000 mL

## 2023-04-16 MED ORDER — FENTANYL CITRATE (PF) 100 MCG/2ML IJ SOLN
INTRAMUSCULAR | Status: AC
  Filled 2023-04-16: qty 2

## 2023-04-16 MED ORDER — LIDOCAINE HCL (PF) 2 % IJ SOLN
INTRAMUSCULAR | Status: AC
  Filled 2023-04-16: qty 5

## 2023-04-16 MED ORDER — KETOROLAC TROMETHAMINE 30 MG/ML IJ SOLN
INTRAMUSCULAR | Status: AC
  Filled 2023-04-16: qty 1

## 2023-04-16 MED ORDER — CHLORHEXIDINE GLUCONATE 0.12 % MT SOLN: 15.0000 mL | Freq: Once | OROMUCOSAL | Status: DC

## 2023-04-16 MED ORDER — KETOROLAC TROMETHAMINE 30 MG/ML IJ SOLN
INTRAMUSCULAR | Status: DC | PRN
  Administered 2023-04-16: 30 mg via INTRAVENOUS

## 2023-04-16 MED ORDER — CEFAZOLIN SODIUM-DEXTROSE 2-4 GM/100ML-% IV SOLN: 2.0000 g | INTRAVENOUS | Status: DC

## 2023-04-16 MED ORDER — FENTANYL CITRATE (PF) 100 MCG/2ML IJ SOLN
INTRAMUSCULAR | Status: DC | PRN
  Administered 2023-04-16 (×4): 25 ug via INTRAVENOUS

## 2023-04-16 MED ORDER — CHLORHEXIDINE GLUCONATE 0.12 % MT SOLN
OROMUCOSAL | Status: AC
  Filled 2023-04-16: qty 15

## 2023-04-16 MED ORDER — ONDANSETRON HCL 4 MG/2ML IJ SOLN
INTRAMUSCULAR | Status: AC
  Filled 2023-04-16: qty 2

## 2023-04-16 SURGICAL SUPPLY — 31 items
BANDAGE ESMARK 4X12 BL STRL LF (DISPOSABLE) ×1 IMPLANT
BLADE SURG 15 STRL LF DISP TIS (BLADE) ×1 IMPLANT
BNDG ELASTIC 3X5.8 VLCR NS LF (GAUZE/BANDAGES/DRESSINGS) ×1 IMPLANT
BNDG ESMARK 4X12 BLUE STRL LF (DISPOSABLE) ×1 IMPLANT
BNDG GAUZE DERMACEA FLUFF 4 (GAUZE/BANDAGES/DRESSINGS) IMPLANT
BNDG GAUZE ELAST 4 BULKY (GAUZE/BANDAGES/DRESSINGS) ×1 IMPLANT
CHLORAPREP W/TINT 26 (MISCELLANEOUS) ×1 IMPLANT
CLOTH BEACON ORANGE TIMEOUT ST (SAFETY) ×1 IMPLANT
COVER LIGHT HANDLE STERIS (MISCELLANEOUS) ×2 IMPLANT
CUFF TOURN SGL QUICK 18X4 (TOURNIQUET CUFF) ×1 IMPLANT
ELECT REM PT RETURN 9FT ADLT (ELECTROSURGICAL) ×1 IMPLANT
ELECTRODE REM PT RTRN 9FT ADLT (ELECTROSURGICAL) ×1 IMPLANT
GAUZE SPONGE 4X4 12PLY STRL (GAUZE/BANDAGES/DRESSINGS) ×1 IMPLANT
GAUZE XEROFORM 1X8 LF (GAUZE/BANDAGES/DRESSINGS) ×1 IMPLANT
GLOVE BIOGEL PI IND STRL 7.0 (GLOVE) ×2 IMPLANT
GLOVE BIOGEL PI IND STRL 8.5 (GLOVE) ×1 IMPLANT
GLOVE SKINSENSE STRL SZ8.0 LF (GLOVE) ×1 IMPLANT
GOWN STRL REUS W/TWL LRG LVL3 (GOWN DISPOSABLE) ×1 IMPLANT
GOWN STRL REUS W/TWL XL LVL3 (GOWN DISPOSABLE) ×1 IMPLANT
KIT TURNOVER KIT A (KITS) ×1 IMPLANT
MANIFOLD NEPTUNE II (INSTRUMENTS) ×1 IMPLANT
NDL HYPO 21X1.5 SAFETY (NEEDLE) ×1 IMPLANT
NEEDLE HYPO 21X1.5 SAFETY (NEEDLE) ×1 IMPLANT
NS IRRIG 1000ML POUR BTL (IV SOLUTION) ×1 IMPLANT
PACK BASIC LIMB (CUSTOM PROCEDURE TRAY) ×1 IMPLANT
PAD ARMBOARD POSITIONER FOAM (MISCELLANEOUS) ×1 IMPLANT
POSITIONER HAND ALUMI XLG (MISCELLANEOUS) ×1 IMPLANT
POSITIONER HEAD 8X9X4 ADT (SOFTGOODS) ×1 IMPLANT
SET BASIN LINEN APH (SET/KITS/TRAYS/PACK) ×1 IMPLANT
SUT 3-0 BLK 1X30 PSL (SUTURE) ×1 IMPLANT
SYR CONTROL 10ML LL (SYRINGE) ×1 IMPLANT

## 2023-04-16 NOTE — Brief Op Note (Signed)
 04/16/2023  12:27 PM  PATIENT:  Denny Peon  48 y.o. female  PRE-OPERATIVE DIAGNOSIS:  Right carpal tunnel release  POST-OPERATIVE DIAGNOSIS:  Right carpal tunnel release  PROCEDURE:  Procedure(s): CARPAL TUNNEL RELEASE (Right)  SURGEON:  Surgeons and Role:    Vickki Hearing, MD - Primary  PHYSICIAN ASSISTANT:   ASSISTANTS: none   ANESTHESIA:   local and MAC  EBL:  none   BLOOD ADMINISTERED:none  DRAINS: none   LOCAL MEDICATIONS USED:  MARCAINE   and lidocaine  SPECIMEN:  No Specimen  DISPOSITION OF SPECIMEN:  N/A  COUNTS:  YES  TOURNIQUET:   Total Tourniquet Time Documented: Upper Arm (Right) - 18 minutes Total: Upper Arm (Right) - 18 minutes   DICTATION: .Reubin Milan Dictation  PLAN OF CARE: Discharge to home after PACU  PATIENT DISPOSITION:  PACU - hemodynamically stable.   Delay start of Pharmacological VTE agent (>24hrs) due to surgical blood loss or risk of bleeding: not applicable

## 2023-04-16 NOTE — Anesthesia Preprocedure Evaluation (Signed)
 Anesthesia Evaluation  Patient identified by MRN, date of birth, ID band Patient awake    Reviewed: Allergy & Precautions, H&P , NPO status , Patient's Chart, lab work & pertinent test results, reviewed documented beta blocker date and time   Airway Mallampati: II  TM Distance: >3 FB Neck ROM: full    Dental no notable dental hx. (+) Dental Advisory Given, Teeth Intact   Pulmonary neg pulmonary ROS   Pulmonary exam normal breath sounds clear to auscultation       Cardiovascular Exercise Tolerance: Good negative cardio ROS Normal cardiovascular exam Rhythm:regular Rate:Normal     Neuro/Psych  Headaches PSYCHIATRIC DISORDERS Anxiety Depression    vertigo    GI/Hepatic negative GI ROS, Neg liver ROS,,,  Endo/Other  negative endocrine ROS    Renal/GU negative Renal ROS  negative genitourinary   Musculoskeletal   Abdominal   Peds  Hematology negative hematology ROS (+)   Anesthesia Other Findings   Reproductive/Obstetrics negative OB ROS                             Anesthesia Physical Anesthesia Plan  ASA: 2  Anesthesia Plan: General   Post-op Pain Management: Minimal or no pain anticipated   Induction: Intravenous  PONV Risk Score and Plan: Propofol infusion  Airway Management Planned: Nasal Cannula and Natural Airway  Additional Equipment: None  Intra-op Plan:   Post-operative Plan:   Informed Consent: I have reviewed the patients History and Physical, chart, labs and discussed the procedure including the risks, benefits and alternatives for the proposed anesthesia with the patient or authorized representative who has indicated his/her understanding and acceptance.     Dental Advisory Given  Plan Discussed with: CRNA  Anesthesia Plan Comments:         Anesthesia Quick Evaluation

## 2023-04-16 NOTE — Anesthesia Procedure Notes (Addendum)
 Procedure Name: MAC Date/Time: 04/16/2023 11:45 AM  Performed by: Lorin Glass, CRNAPre-anesthesia Checklist: Patient identified, Emergency Drugs available, Suction available and Patient being monitored Oxygen Delivery Method: Simple face mask Induction Type: IV induction

## 2023-04-16 NOTE — Interval H&P Note (Signed)
 History and Physical Interval Note:  04/16/2023 11:41 AM  Brittany Hood  has presented today for surgery, with the diagnosis of Right carpal tunnel release.  The various methods of treatment have been discussed with the patient and family. After consideration of risks, benefits and other options for treatment, the patient has consented to  Procedure(s): CARPAL TUNNEL RELEASE (Right) as a surgical intervention.  The patient's history has been reviewed, patient examined, no change in status, stable for surgery.  I have reviewed the patient's chart and labs.  Questions were answered to the patient's satisfaction.     Fuller Canada

## 2023-04-16 NOTE — Op Note (Signed)
 04/16/2023  12:27 PM  PATIENT:  Brittany Hood  48 y.o. female  PRE-OPERATIVE DIAGNOSIS:  Right carpal tunnel release  POST-OPERATIVE DIAGNOSIS:  Right carpal tunnel release  PROCEDURE:  Procedure(s): CARPAL TUNNEL RELEASE (Right)  SURGEON:  Surgeons and Role:    Vickki Hearing, MD - Primary  Surgical findings there was a hematoma inside the carpal tunnel over one of the tendons and the carpal tunnel contents were otherwise normal with the exception of the mild compression and discoloration of the median nerve  The surgery was done as follows  The patient was seen in the preop area the surgical site was confirmed as the right hand which was marked after chart review.  Patient was taken to the operating room for anesthetic using MAC and local.  After MAC anesthesia was established the arm was prepped and draped sterilely timeout was completed the limb was exsanguinated with Fornage Esmarch and tourniquet elevated to 250 mmHg.  Local anesthetic 1% lidocaine was injected into the incision area.  With the palm supinated the incision was made in line with the radial border of the ring finger starting at the distal flexion crease and carrying it distally to the end of the transverse carpal ligament.  Subcutaneous tissue was divided palmar fascia was divided.  The dens of the carpal tunnel was identified by identifying the fat in the palm.  Blunt instrument was passed under the carpal tunnel and the carpal tunnel was released sharply  The contents were noted as stated above.  The wound was irrigated and closed with 3-0 nylon interrupted sutures followed by injection of Marcaine   PHYSICIAN ASSISTANT:   ASSISTANTS: none   ANESTHESIA:   local and MAC  EBL:  none   BLOOD ADMINISTERED:none  DRAINS: none   LOCAL MEDICATIONS USED:  MARCAINE   and lidocaine  SPECIMEN:  No Specimen  DISPOSITION OF SPECIMEN:  N/A  COUNTS:  YES  TOURNIQUET:   Total Tourniquet Time  Documented: Upper Arm (Right) - 18 minutes Total: Upper Arm (Right) - 18 minutes   DICTATION: .Reubin Milan Dictation  PLAN OF CARE: Discharge to home after PACU  PATIENT DISPOSITION:  PACU - hemodynamically stable.   Delay start of Pharmacological VTE agent (>24hrs) due to surgical blood loss or risk of bleeding: not applicable

## 2023-04-16 NOTE — Anesthesia Postprocedure Evaluation (Signed)
 Anesthesia Post Note  Patient: Brittany Hood  Procedure(s) Performed: CARPAL TUNNEL RELEASE (Right: Hand)  Patient location during evaluation: PACU Anesthesia Type: General Level of consciousness: awake and alert Pain management: pain level controlled Vital Signs Assessment: post-procedure vital signs reviewed and stable Respiratory status: spontaneous breathing, nonlabored ventilation, respiratory function stable and patient connected to nasal cannula oxygen Cardiovascular status: blood pressure returned to baseline and stable Postop Assessment: no apparent nausea or vomiting Anesthetic complications: no   There were no known notable events for this encounter.   Last Vitals:  Vitals:   04/16/23 1228 04/16/23 1230  BP: 128/67 112/63  Pulse: 99 80  Resp: (!) 25 18  Temp: 36.5 C   SpO2: 98% 96%    Last Pain:  Vitals:   04/16/23 1126  TempSrc:   PainSc: 0-No pain                 [name]

## 2023-04-16 NOTE — Transfer of Care (Signed)
 Immediate Anesthesia Transfer of Care Note  Patient: Brittany Hood  Procedure(s) Performed: CARPAL TUNNEL RELEASE (Right: Hand)  Patient Location: PACU  Anesthesia Type:General  Level of Consciousness: drowsy  Airway & Oxygen Therapy: Patient Spontanous Breathing and Patient connected to face mask oxygen  Post-op Assessment: Report given to RN and Post -op Vital signs reviewed and stable  Post vital signs: Reviewed and stable  Last Vitals:  Vitals Value Taken Time  BP 128/67 04/16/23 1228  Temp 97.7   Pulse 84 04/16/23 1229  Resp 23 04/16/23 1229  SpO2 97 % 04/16/23 1229  Vitals shown include unfiled device data.  Last Pain:  Vitals:   04/16/23 1126  TempSrc:   PainSc: 0-No pain         Complications: No notable events documented.

## 2023-04-17 ENCOUNTER — Encounter (HOSPITAL_COMMUNITY): Payer: Self-pay | Admitting: Orthopedic Surgery

## 2023-04-18 ENCOUNTER — Telehealth: Payer: Self-pay | Admitting: Orthopedic Surgery

## 2023-04-18 NOTE — Telephone Encounter (Signed)
 DR. Romeo Apple   Patient spouse called for her and states she is in a lot of pain and the medicine she was prescribed is not working   On a scales from 1 to 10 she is like a 13.  Pharmacy:  [facility] in [place]   You can call her husband back Ramon Dredge [phone]

## 2023-04-18 NOTE — Telephone Encounter (Signed)
 I called and discussed with her and her husband they both voiced understanding

## 2023-05-02 ENCOUNTER — Ambulatory Visit (INDEPENDENT_AMBULATORY_CARE_PROVIDER_SITE_OTHER): Admitting: Orthopedic Surgery

## 2023-05-02 DIAGNOSIS — G5601 Carpal tunnel syndrome, right upper limb: Secondary | ICD-10-CM

## 2023-05-02 DIAGNOSIS — M25461 Effusion, right knee: Secondary | ICD-10-CM

## 2023-05-02 DIAGNOSIS — M25561 Pain in right knee: Secondary | ICD-10-CM

## 2023-05-02 DIAGNOSIS — G8929 Other chronic pain: Secondary | ICD-10-CM

## 2023-05-02 NOTE — Progress Notes (Signed)
 Chief Complaint  Patient presents with   Routine Post Op   Encounter Diagnoses  Name Primary?   Carpal tunnel syndrome of right wrist s/p release 04/16/23 Yes   Effusion, right knee    Chronic pain of right knee    Carpal tunnel syndrome of right wrist    S/p injection right knee   Post op # 1 CTR   Tunnel incision is good we put some Steri-Strips to keep the wound edges together applied a Band-Aid should come back in a week and check the wound again  As far as the knee goes the patient is walking so based on her labs below from the aspiration I suspect she had a synovitis of the joint  Labs:   Component Ref Range & Units (hover)3 wk agoSite  RIGHT KNEE   Color,  Synovial RED Abnormal   Appearance-SynovialBLOODY,CLOUDY Abnormal    WBC, Synovial34,530 High   Comment: Verified by repeat analysis.  .  Neutrophil, Synovial60 High    Lymphocytes-Synovial Fld  27   Monocyte/Macrophage  13   Eosinophils-Synovial  0   Basophils, %  0   Synoviocytes, %  0   Crystals, Fluid  Comment: None seen

## 2023-05-09 ENCOUNTER — Other Ambulatory Visit: Payer: Self-pay | Admitting: Orthopedic Surgery

## 2023-05-09 ENCOUNTER — Telehealth: Payer: Self-pay | Admitting: Orthopedic Surgery

## 2023-05-09 ENCOUNTER — Ambulatory Visit (INDEPENDENT_AMBULATORY_CARE_PROVIDER_SITE_OTHER): Admitting: Orthopedic Surgery

## 2023-05-09 ENCOUNTER — Encounter: Payer: Self-pay | Admitting: Orthopedic Surgery

## 2023-05-09 DIAGNOSIS — G5601 Carpal tunnel syndrome, right upper limb: Secondary | ICD-10-CM

## 2023-05-09 DIAGNOSIS — M25561 Pain in right knee: Secondary | ICD-10-CM

## 2023-05-09 DIAGNOSIS — G8929 Other chronic pain: Secondary | ICD-10-CM

## 2023-05-09 DIAGNOSIS — M25461 Effusion, right knee: Secondary | ICD-10-CM

## 2023-05-09 MED ORDER — TIZANIDINE HCL 4 MG PO TABS: 4.0000 mg | ORAL_TABLET | Freq: Four times a day (QID) | ORAL | 0 refills | Status: DC | PRN

## 2023-05-09 NOTE — Progress Notes (Signed)
   LMP 03/30/2023 (Approximate)   There is no height or weight on file to calculate BMI.  Chief Complaint  Patient presents with   Post-op Follow-up    Encounter Diagnosis  Name Primary?   Carpal tunnel syndrome of right wrist s/p release 04/16/23 Yes    DOI/DOS/ Date: 04/16/23  Improved     Previously was unable to do the knee physical therapy due to her insurance high out of pocket costs but now on Medicaid she can go. Put in new orders and she will call to schedule

## 2023-05-09 NOTE — Progress Notes (Signed)
 Chief Complaint  Patient presents with   Post-op Follow-up   Brittany Hood is doing much better her wound looks good she is interested in getting the left carpal tunnel released because she is having trouble holding onto things  Her right knee which probably had some type of synovitis which was alleviated by prednisone can now go ahead and proceed with physical therapy to improve strengthening and patellofemoral mechanics  Left carpal tunnel release plan for 3 weeks

## 2023-05-09 NOTE — Telephone Encounter (Signed)
 Dr. Mort Sawyers pt - spoke w/Edward, the pt's spouse, he stated that they are at [facility] [place] and nothing has been sent in yet.  He said it's supposed to be a muscle relaxer.

## 2023-05-09 NOTE — Patient Instructions (Signed)
 Physical therapy has been ordered for you at [facility]. They should call you to schedule, [phone] is the phone number to call, if you want to call to schedule.

## 2023-05-23 ENCOUNTER — Ambulatory Visit (INDEPENDENT_AMBULATORY_CARE_PROVIDER_SITE_OTHER): Admitting: Orthopedic Surgery

## 2023-05-23 ENCOUNTER — Encounter: Payer: Self-pay | Admitting: Orthopedic Surgery

## 2023-05-23 ENCOUNTER — Ambulatory Visit (HOSPITAL_COMMUNITY): Attending: Orthopedic Surgery

## 2023-05-23 ENCOUNTER — Other Ambulatory Visit: Payer: Self-pay

## 2023-05-23 DIAGNOSIS — M25461 Effusion, right knee: Secondary | ICD-10-CM | POA: Diagnosis not present

## 2023-05-23 DIAGNOSIS — M65931 Unspecified synovitis and tenosynovitis, right forearm: Secondary | ICD-10-CM

## 2023-05-23 DIAGNOSIS — M25531 Pain in right wrist: Secondary | ICD-10-CM

## 2023-05-23 DIAGNOSIS — M25561 Pain in right knee: Secondary | ICD-10-CM | POA: Diagnosis not present

## 2023-05-23 DIAGNOSIS — G5601 Carpal tunnel syndrome, right upper limb: Secondary | ICD-10-CM

## 2023-05-23 DIAGNOSIS — G8929 Other chronic pain: Secondary | ICD-10-CM

## 2023-05-23 DIAGNOSIS — M25431 Effusion, right wrist: Secondary | ICD-10-CM | POA: Diagnosis not present

## 2023-05-23 DIAGNOSIS — R262 Difficulty in walking, not elsewhere classified: Secondary | ICD-10-CM | POA: Diagnosis not present

## 2023-05-23 MED ORDER — PREDNISONE 10 MG PO TABS: 10.0000 mg | ORAL_TABLET | Freq: Three times a day (TID) | ORAL | 0 refills | Status: DC

## 2023-05-23 NOTE — Progress Notes (Signed)
 Follow-up visit  New complaint in the postop period status post RIGHT carpal tunnel release 04/16/2023.   Chief Complaint  Patient presents with   Post-op Follow-up   48 year old female complains of locking and pain in her right wrist  It is hard to elucidate the exact position or cause of locking but she does notice it if she reaches behind her  The exam of the right wrist  Is warm tender and swollen  It is painful in the range of motion is compromised  Recommend bracing Prednisone  Rheumatoid panel with uric acid Return with lab result  Meds ordered this encounter  Medications   predniSONE  (DELTASONE ) 10 MG tablet    Sig: Take 1 tablet (10 mg total) by mouth 3 (three) times daily.    Dispense:  42 tablet    Refill:  0

## 2023-05-23 NOTE — Progress Notes (Signed)
   There were no vitals taken for this visit.  There is no height or weight on file to calculate BMI.  Chief Complaint  Patient presents with   Post-op Follow-up    Encounter Diagnosis  Name Primary?   Carpal tunnel syndrome of right wrist s/p release 04/16/23 Yes    DOI/DOS/ Date: 04/16/23  Unchanged/states her right wrist pops when she tries to use a fork and it locks up

## 2023-05-23 NOTE — Therapy (Signed)
 OUTPATIENT PHYSICAL THERAPY LOWER EXTREMITY EVALUATION   Patient Name: Brittany Hood MRN: [id number] DOB:July 17, 1975, 48 y.o., female Today's Date: 05/23/2023  END OF SESSION:  PT End of Session - 05/23/23 1158     Visit Number 1    Number of Visits 8    Date for PT Re-Evaluation 06/20/23    Authorization Type [place] [employer]    Authorization Time Period please see auth    PT Start Time 1150    PT Stop Time 1230    PT Time Calculation (min) 40 min    Activity Tolerance Patient tolerated treatment well;Patient limited by pain    Behavior During Therapy [facility] for tasks assessed/performed             Past Medical History:  Diagnosis Date   Anemia    Anxiety    Depression    Scoliosis    Vertigo    Past Surgical History:  Procedure Laterality Date   CARPAL TUNNEL RELEASE Right 04/16/2023   Procedure: CARPAL TUNNEL RELEASE;  Surgeon: Darrin Emerald, MD;  Location: [facility];  Service: Orthopedics;  Laterality: Right;   CESAREAN SECTION     x2   DIAGNOSTIC LAPAROSCOPY     TUBAL LIGATION     Patient Active Problem List   Diagnosis Date Noted   Carpal tunnel syndrome of right wrist 04/16/2023   Migraine headache 11/27/2006   SYMPTOM, COUGH 11/27/2006    PCP: sees them for the first time in July   REFERRING PROVIDER: Darrin Emerald, MD  REFERRING DIAG:  Diagnosis  M25.461 (ICD-10-CM) - Effusion, right knee  M25.561,G89.29 (ICD-10-CM) - Chronic pain of right knee    THERAPY DIAG:  Chronic pain of right knee - Plan: PT plan of care cert/re-cert  Difficulty in walking, not elsewhere classified - Plan: PT plan of care cert/re-cert  Effusion, right knee - Plan: PT plan of care cert/re-cert  Rationale for Evaluation and Treatment: Rehabilitation  ONSET DATE: 1.5 years   SUBJECTIVE:   SUBJECTIVE STATEMENT: Saw [name] back in March; drew fluid off; felt better but then the pain and fluid came back; so referred to therapy.  She has fallen a  couple of times onto right knee and thinks that may have started it. No x-ray's done per her report but did see an x-ray in [facility]?  PERTINENT HISTORY: Scoliosis S/p right CPT 04/16/23 Waiting to have left CPT done PAIN:  Are you having pain? Yes: NPRS scale: 4/10; never a 0; goes all the way up to a 10 Pain location: anterior knee Pain description: giving way; going to buckle Aggravating factors: standing Relieving factors: rest, ibuprofen  PRECAUTIONS: Fall  RED FLAGS: None   WEIGHT BEARING RESTRICTIONS: No  FALLS:  Has patient fallen in last 6 months? Yes. Number of falls 2 lost balance  OCCUPATION: currently not working   PLOF: Independent  PATIENT GOALS: try to get my knee better  NEXT MD VISIT: today and then later in May  OBJECTIVE:  Note: Objective measures were completed at Evaluation unless otherwise noted.  DIAGNOSTIC FINDINGS:  CLINICAL DATA:  Fall, right knee pain   EXAM: RIGHT KNEE - COMPLETE 4+ VIEW   COMPARISON:  Radiographs 12/30/2006   FINDINGS: No acute fracture or dislocation. Moderate knee joint effusion. Soft tissues are radiographically unremarkable.   IMPRESSION: No acute fracture or dislocation. Moderate knee joint effusion.     PATIENT SURVEYS:  LEFS 24/80 30%  COGNITION: Overall cognitive status: Within functional limits for  tasks assessed     SENSATION: WFL  EDEMA:  Noted right knee  POSTURE: rounded shoulders, forward head, and right rib hump  PALPATION: Significant rib hump right side thoracic spine  LOWER EXTREMITY ROM:  Active ROM Right eval Left eval  Hip flexion    Hip extension    Hip abduction    Hip adduction    Hip internal rotation    Hip external rotation    Knee flexion 110 144  Knee extension -6 0  Ankle dorsiflexion    Ankle plantarflexion    Ankle inversion    Ankle eversion     (Blank rows = not tested)  LOWER EXTREMITY MMT:  MMT Right eval Left eval  Hip flexion 3-* 4+  Hip  extension    Hip abduction    Hip adduction    Hip internal rotation    Hip external rotation    Knee flexion    Knee extension 3-* 5  Ankle dorsiflexion 4+ 5  Ankle plantarflexion    Ankle inversion    Ankle eversion     (Blank rows = not tested)   FUNCTIONAL TESTS:  30 seconds chair stand test x 2  Needs moderate assist for supine to sit  GAIT: Distance walked: 50 ft in PT gym  Assistive device utilized: None Level of assistance: SBA and CGA Comments: very antalgic gait especially with initial steps after standing up from sitting                                                                                                                                TREATMENT DATE: 05/23/23 physical therapy evaluation and HEP instruction; trial of kinesiotape right knee   PATIENT EDUCATION:  Education details: Patient educated on exam findings, POC, scope of PT, HEP, and how scoliosis can effect the body.discussed bed cane to help with supine to sit Person educated: Patient Education method: Explanation, Demonstration, and Handouts Education comprehension: verbalized understanding, returned demonstration, verbal cues required, and tactile cues required  HOME EXERCISE PROGRAM: Access Code: [facility] URL: [email or web address] Date: 05/23/2023 Prepared by: [facility] - [facility]  Exercises - Supine Quad Set  - 2 x daily - 7 x weekly - 1 sets - 10 reps - 5 sec hold - Supine Heel Slide  - 2 x daily - 7 x weekly - 1 sets - 10 reps - Seated Long Arc Quad  - 2 x daily - 7 x weekly - 2 sets - 10 reps  ASSESSMENT:  CLINICAL IMPRESSION: Patient is a 48 y.o. female who was seen today for physical therapy evaluation and treatment for  Diagnosis  M25.461 (ICD-10-CM) - Effusion, right knee  M25.561,G89.29 (ICD-10-CM) - Chronic pain of right knee   Patient demonstrates muscle weakness, reduced ROM, and fascial restrictions which are likely contributing to symptoms of pain and are  negatively impacting patient ability to perform ADLs and functional mobility tasks. She also demonstrates significant scoliosis effecting  her functional mobility.  Patient will benefit from skilled physical therapy services to address these deficits to reduce pain and improve level of function with ADLs and functional mobility tasks. .   OBJECTIVE IMPAIRMENTS: Abnormal gait, decreased activity tolerance, decreased knowledge of condition, decreased mobility, difficulty walking, decreased ROM, decreased strength, increased fascial restrictions, impaired perceived functional ability, postural dysfunction, and pain.   ACTIVITY LIMITATIONS: carrying, lifting, bending, sitting, standing, squatting, sleeping, stairs, transfers, bed mobility, bathing, locomotion level, and caring for others  PARTICIPATION LIMITATIONS: meal prep, cleaning, laundry, shopping, community activity, and yard work  [facility] POTENTIAL: Good  CLINICAL DECISION MAKING: Evolving/moderate complexity  EVALUATION COMPLEXITY: Moderate   GOALS: Goals reviewed with patient? No  SHORT TERM GOALS: Target date: 06/06/2023 patient will be independent with initial HEP   Baseline: Goal status: INITIAL  2.  Patient will report 30% improvement overall  Baseline:  Goal status: INITIAL   LONG TERM GOALS: Target date: 06/20/2023  Patient will be independent in self management strategies to improve quality of life and functional outcomes.  Baseline:  Goal status: INITIAL  2.  Patient will report 50% improvement overall  Baseline:  Goal status: INITIAL  3.  Patient will improve LEFS score by  16 points to demonstrate improved perceived function  Baseline: 24/80 Goal status: INITIAL  4.  Patient will improve 30 sec  sit to stand score to 5  to demonstrate improved functional mobility and increased leg strength.    Baseline: 2 Goal status: INITIAL  5.  Patient will increase  right knee mobility to -2 to 120 to promote  normal navigation of steps; step over step pattern  Baseline:  Goal status: INITIAL  6.  Patient will increase right leg MMT's to 4+ to 5/5 to allow navigation of steps without gait deviation or loss of balance   Baseline: see above Goal status: INITIAL   PLAN:  PT FREQUENCY: 2x/week  PT DURATION: 4 weeks  PLANNED INTERVENTIONS: 97164- PT Re-evaluation, 97110-Therapeutic exercises, 97530- Therapeutic activity, 97112- Neuromuscular re-education, 97535- Self Care, 16109- Manual therapy, [phone]- Gait training, [phone]- Orthotic Fit/training, [phone]- Canalith repositioning, [id number]- Aquatic Therapy, [phone]- Splinting, Patient/Family education, Balance training, Stair training, Taping, Dry Needling, Joint mobilization, Joint manipulation, Spinal manipulation, Spinal mobilization, Scar mobilization, and DME instructions.   PLAN FOR NEXT SESSION: Review HEP and goals; right knee mobility and strength; functional strength and balance; patient with significant scoliosis that will be a limitation to her progress.     12:52 PM, 05/23/23 Legrand Lasser Small Treasure Ingrum MPT [facility] [place] [phone] Ph:[phone]    Managed Medicaid Authorization Request  Visit Dx Codes: M25.461, R26.2, M25.561  Functional Tool Score: LEFS 24/80 30%  For all possible CPT codes, reference the Planned Interventions line above.     Check all conditions that are expected to impact treatment: {Conditions expected to impact treatment:Musculoskeletal disorders scoliosis   If treatment provided at initial evaluation, no treatment charged due to lack of authorization.

## 2023-05-25 LAB — ANTI-NUCLEAR AB-TITER (ANA TITER): ANA Titer 1: 1:40 {titer} — ABNORMAL HIGH

## 2023-05-25 LAB — C-REACTIVE PROTEIN: CRP: 19.9 mg/L — ABNORMAL HIGH (ref <8.0)

## 2023-05-25 LAB — ANA: Anti Nuclear Antibody (ANA): POSITIVE — AB

## 2023-05-25 LAB — URIC ACID: Uric Acid, Serum: 5.8 mg/dL (ref 2.5–7.0)

## 2023-05-25 LAB — RHEUMATOID FACTOR: Rheumatoid fact SerPl-aCnc: 40 [IU]/mL — ABNORMAL HIGH (ref <14)

## 2023-05-28 ENCOUNTER — Ambulatory Visit (HOSPITAL_COMMUNITY)

## 2023-05-29 ENCOUNTER — Ambulatory Visit (HOSPITAL_COMMUNITY)

## 2023-05-29 DIAGNOSIS — G8929 Other chronic pain: Secondary | ICD-10-CM

## 2023-05-29 DIAGNOSIS — M25561 Pain in right knee: Secondary | ICD-10-CM | POA: Diagnosis not present

## 2023-05-29 DIAGNOSIS — R262 Difficulty in walking, not elsewhere classified: Secondary | ICD-10-CM

## 2023-05-29 DIAGNOSIS — M25461 Effusion, right knee: Secondary | ICD-10-CM | POA: Diagnosis not present

## 2023-05-29 NOTE — Therapy (Signed)
 OUTPATIENT PHYSICAL THERAPY LOWER EXTREMITY EVALUATION   Patient Name: Brittany Hood MRN: [id number] DOB:April 17, 1975, 48 y.o., female Today's Date: 05/29/2023  END OF SESSION:  PT End of Session - 05/29/23 1027     Visit Number 2    Number of Visits 8    Date for PT Re-Evaluation 06/20/23    Authorization Type [place] Medicaid Healthy Blue    Authorization Time Period healthy blue approved 7 visits from 4/24 to 6/22    Authorization - Visit Number 1    Authorization - Number of Visits 7    Progress Note Due on Visit 7    PT Start Time 1020    PT Stop Time 1100    PT Time Calculation (min) 40 min    Activity Tolerance Patient tolerated treatment well;Patient limited by pain    Behavior During Therapy [facility] for tasks assessed/performed             Past Medical History:  Diagnosis Date   Anemia    Anxiety    Depression    Scoliosis    Vertigo    Past Surgical History:  Procedure Laterality Date   CARPAL TUNNEL RELEASE Right 04/16/2023   Procedure: CARPAL TUNNEL RELEASE;  Surgeon: Darrin Emerald, MD;  Location: [facility];  Service: Orthopedics;  Laterality: Right;   CESAREAN SECTION     x2   DIAGNOSTIC LAPAROSCOPY     TUBAL LIGATION     Patient Active Problem List   Diagnosis Date Noted   Carpal tunnel syndrome of right wrist 04/16/2023   Migraine headache 11/27/2006   SYMPTOM, COUGH 11/27/2006    PCP: sees them for the first time in July   REFERRING PROVIDER: Darrin Emerald, MD  REFERRING DIAG:  Diagnosis  M25.461 (ICD-10-CM) - Effusion, right knee  M25.561,G89.29 (ICD-10-CM) - Chronic pain of right knee    THERAPY DIAG:  Chronic pain of right knee  Difficulty in walking, not elsewhere classified  Effusion, right knee  Rationale for Evaluation and Treatment: Rehabilitation  ONSET DATE: 1.5 years   SUBJECTIVE:   SUBJECTIVE STATEMENT: Having a good day today; was able to do some house cleaning the past few days  Eval: Saw [name] back in  March; drew fluid off; felt better but then the pain and fluid came back; so referred to therapy.  She has fallen a couple of times onto right knee and thinks that may have started it. No x-ray's done per her report but did see an x-ray in [facility]?  PERTINENT HISTORY: Scoliosis S/p right CPT 04/16/23 Waiting to have left CPT done PAIN:  Are you having pain? Yes: NPRS scale: 4/10; never a 0; goes all the way up to a 10 Pain location: anterior knee Pain description: giving way; going to buckle Aggravating factors: standing Relieving factors: rest, ibuprofen  PRECAUTIONS: Fall  RED FLAGS: None   WEIGHT BEARING RESTRICTIONS: No  FALLS:  Has patient fallen in last 6 months? Yes. Number of falls 2 lost balance  OCCUPATION: currently not working   PLOF: Independent  PATIENT GOALS: try to get my knee better  NEXT MD VISIT: today and then later in May  OBJECTIVE:  Note: Objective measures were completed at Evaluation unless otherwise noted.  DIAGNOSTIC FINDINGS:  CLINICAL DATA:  Fall, right knee pain   EXAM: RIGHT KNEE - COMPLETE 4+ VIEW   COMPARISON:  Radiographs 12/30/2006   FINDINGS: No acute fracture or dislocation. Moderate knee joint effusion. Soft tissues are radiographically unremarkable.  IMPRESSION: No acute fracture or dislocation. Moderate knee joint effusion.     PATIENT SURVEYS:  LEFS 24/80 30%  COGNITION: Overall cognitive status: Within functional limits for tasks assessed     SENSATION: WFL  EDEMA:  Noted right knee  POSTURE: rounded shoulders, forward head, and right rib hump  PALPATION: Significant rib hump right side thoracic spine  LOWER EXTREMITY ROM:  Active ROM Right eval Left eval  Hip flexion    Hip extension    Hip abduction    Hip adduction    Hip internal rotation    Hip external rotation    Knee flexion 110 144  Knee extension -6 0  Ankle dorsiflexion    Ankle plantarflexion    Ankle inversion    Ankle eversion      (Blank rows = not tested)  LOWER EXTREMITY MMT:  MMT Right eval Left eval  Hip flexion 3-* 4+  Hip extension    Hip abduction    Hip adduction    Hip internal rotation    Hip external rotation    Knee flexion    Knee extension 3-* 5  Ankle dorsiflexion 4+ 5  Ankle plantarflexion    Ankle inversion    Ankle eversion     (Blank rows = not tested)   FUNCTIONAL TESTS:  30 seconds chair stand test x 2  Needs moderate assist for supine to sit  GAIT: Distance walked: 50 ft in PT gym  Assistive device utilized: None Level of assistance: SBA and CGA Comments: very antalgic gait especially with initial steps after standing up from sitting                                                                                                                                TREATMENT DATE:  05/29/23 Review of HEP and goals Seated: LAQ's 2 x 10 Heel toe raises 2 x 10 Yellow theraband hip flexion 2 x 10 each Yellow theraband hip abduction 2 x 10  Standing: Heel raises x 10 Hip abduction 2 x 10 each Hip extension 2 x 10 each Sit to stand 2 x 5 no UE assist    05/23/23 physical therapy evaluation and HEP instruction; trial of kinesiotape right knee   PATIENT EDUCATION:  Education details: Patient educated on exam findings, POC, scope of PT, HEP, and how scoliosis can effect the body.discussed bed cane to help with supine to sit Person educated: Patient Education method: Explanation, Demonstration, and Handouts Education comprehension: verbalized understanding, returned demonstration, verbal cues required, and tactile cues required  HOME EXERCISE PROGRAM: Access Code: [facility] URL: [email or web address] Date: 05/29/2023 Prepared by: [facility] - [facility]  Exercises -- Seated March with Resistance  - 2 x daily - 7 x weekly - 2 sets - 10 reps - Seated Hip Abduction with Resistance  - 2 x daily - 7 x weekly - 2 sets - 10 reps - Standing Hip Abduction with Counter Support  -  1  x daily - 7 x weekly - 2 sets - 10 reps - Standing Hip Extension with Counter Support  - 1 x daily - 7 x weekly - 2 sets - 10 reps - Sit to Stand  - 1 x daily - 7 x weekly - 2 sets - 5 reps  Access Code: [email or web address] URL: [email or web address] Date: 05/23/2023  Exercises - Supine Quad Set  - 2 x daily - 7 x weekly - 1 sets - 10 reps - 5 sec hold - Supine Heel Slide  - 2 x daily - 7 x weekly - 1 sets - 10 reps - Seated Long Arc Quad  - 2 x daily - 7 x weekly - 2 sets - 10 reps  ASSESSMENT:  CLINICAL IMPRESSION: Today's session started with a review of HEP and goals; patient verbalizes agreement with set rehab goals.  Noted she is walking with increased speed and more upright.  Patient moving better and is more tolerance of exercise today. Needs postural cues; significant scoliosis continues to limit mobility.   Able to progress exercise without issue. Updated HEP.   Patient will benefit from continued skilled therapy services to address deficits and promote return to optimal function.        Eval:Patient is a 48 y.o. female who was seen today for physical therapy evaluation and treatment for  Diagnosis  M25.461 (ICD-10-CM) - Effusion, right knee  M25.561,G89.29 (ICD-10-CM) - Chronic pain of right knee   Patient demonstrates muscle weakness, reduced ROM, and fascial restrictions which are likely contributing to symptoms of pain and are negatively impacting patient ability to perform ADLs and functional mobility tasks. She also demonstrates significant scoliosis effecting her functional mobility.  Patient will benefit from skilled physical therapy services to address these deficits to reduce pain and improve level of function with ADLs and functional mobility tasks. .   OBJECTIVE IMPAIRMENTS: Abnormal gait, decreased activity tolerance, decreased knowledge of condition, decreased mobility, difficulty walking, decreased ROM, decreased strength, increased fascial restrictions,  impaired perceived functional ability, postural dysfunction, and pain.   ACTIVITY LIMITATIONS: carrying, lifting, bending, sitting, standing, squatting, sleeping, stairs, transfers, bed mobility, bathing, locomotion level, and caring for others  PARTICIPATION LIMITATIONS: meal prep, cleaning, laundry, shopping, community activity, and yard work  [facility] POTENTIAL: Good  CLINICAL DECISION MAKING: Evolving/moderate complexity  EVALUATION COMPLEXITY: Moderate   GOALS: Goals reviewed with patient? No  SHORT TERM GOALS: Target date: 06/06/2023 patient will be independent with initial HEP   Baseline: Goal status: in progress  2.  Patient will report 30% improvement overall  Baseline:  Goal status:in progress   LONG TERM GOALS: Target date: 06/20/2023  Patient will be independent in self management strategies to improve quality of life and functional outcomes.  Baseline:  Goal status: in progress  2.  Patient will report 50% improvement overall  Baseline:  Goal status: in progress  3.  Patient will improve LEFS score by  16 points to demonstrate improved perceived function  Baseline: 24/80 Goal status: in progress  4.  Patient will improve 30 sec  sit to stand score to 5  to demonstrate improved functional mobility and increased leg strength.    Baseline: 2 Goal status: in progress  5.  Patient will increase  right knee mobility to -2 to 120 to promote normal navigation of steps; step over step pattern  Baseline:  Goal status: in progress  6.  Patient will increase right leg MMT's to 4+ to 5/5 to allow navigation of  steps without gait deviation or loss of balance   Baseline: see above Goal status: in progress   PLAN:  PT FREQUENCY: 2x/week  PT DURATION: 4 weeks  PLANNED INTERVENTIONS: 97164- PT Re-evaluation, 97110-Therapeutic exercises, 97530- Therapeutic activity, 97112- Neuromuscular re-education, 97535- Self Care, 21308- Manual therapy, [phone]- Gait  training, [phone]- Orthotic Fit/training, [phone]- Canalith repositioning, [id number]- Aquatic Therapy, [phone]- Splinting, Patient/Family education, Balance training, Stair training, Taping, Dry Needling, Joint mobilization, Joint manipulation, Spinal manipulation, Spinal mobilization, Scar mobilization, and DME instructions.   PLAN FOR NEXT SESSION:  right knee mobility and strength; functional strength and balance; patient with significant scoliosis that will be a limitation to her progress.  Add weight to LAQ's next visit.     10:58 AM, 05/29/23 Dilyn Osoria Small Morry Veiga MPT [place] [facility] [place] [phone]

## 2023-05-30 ENCOUNTER — Ambulatory Visit: Payer: Self-pay | Admitting: Internal Medicine

## 2023-06-04 ENCOUNTER — Encounter (HOSPITAL_COMMUNITY)

## 2023-06-05 ENCOUNTER — Encounter (HOSPITAL_COMMUNITY)

## 2023-06-06 ENCOUNTER — Ambulatory Visit (HOSPITAL_COMMUNITY): Attending: Orthopedic Surgery

## 2023-06-06 DIAGNOSIS — M25461 Effusion, right knee: Secondary | ICD-10-CM | POA: Insufficient documentation

## 2023-06-06 DIAGNOSIS — M25561 Pain in right knee: Secondary | ICD-10-CM | POA: Insufficient documentation

## 2023-06-06 DIAGNOSIS — R262 Difficulty in walking, not elsewhere classified: Secondary | ICD-10-CM | POA: Insufficient documentation

## 2023-06-06 DIAGNOSIS — G8929 Other chronic pain: Secondary | ICD-10-CM | POA: Diagnosis not present

## 2023-06-06 NOTE — Therapy (Signed)
 OUTPATIENT PHYSICAL THERAPY LOWER EXTREMITY EVALUATION   Patient Name: Brittany Hood MRN: [id number] DOB:[birth year], 48 y.o., female Today's Date: 06/06/2023  END OF SESSION:  PT End of Session - 06/06/23 0808     Visit Number 3    Number of Visits 8    Date for PT Re-Evaluation 06/20/23    Authorization Type [place] Medicaid Healthy Blue    Authorization Time Period healthy blue approved 7 visits from 4/24 to 6/22    Authorization - Visit Number 2    Authorization - Number of Visits 7    Progress Note Due on Visit 7    PT Start Time 0805    PT Stop Time 0844    PT Time Calculation (min) 39 min    Activity Tolerance Patient tolerated treatment well;Patient limited by pain    Behavior During Therapy [facility] for tasks assessed/performed             Past Medical History:  Diagnosis Date   Anemia    Anxiety    Depression    Scoliosis    Vertigo    Past Surgical History:  Procedure Laterality Date   CARPAL TUNNEL RELEASE Right 04/16/2023   Procedure: CARPAL TUNNEL RELEASE;  Surgeon: Darrin Emerald, MD;  Location: [facility];  Service: Orthopedics;  Laterality: Right;   CESAREAN SECTION     x2   DIAGNOSTIC LAPAROSCOPY     TUBAL LIGATION     Patient Active Problem List   Diagnosis Date Noted   Carpal tunnel syndrome of right wrist 04/16/2023   Migraine headache 11/27/2006   SYMPTOM, COUGH 11/27/2006    PCP: sees them for the first time in July   REFERRING PROVIDER: Darrin Emerald, MD  REFERRING DIAG:  Diagnosis  M25.461 (ICD-10-CM) - Effusion, right knee  M25.561,G89.29 (ICD-10-CM) - Chronic pain of right knee    THERAPY DIAG:  Chronic pain of right knee  Difficulty in walking, not elsewhere classified  Effusion, right knee  Rationale for Evaluation and Treatment: Rehabilitation  ONSET DATE: 1.5 years   SUBJECTIVE:   SUBJECTIVE STATEMENT: Has had a pretty good week.  Did overdo it a bit going through clothes to get rid of.  No pain right knee on  arrival today.    Eval: Saw [name] back in March; drew fluid off; felt better but then the pain and fluid came back; so referred to therapy.  She has fallen a couple of times onto right knee and thinks that may have started it. No x-ray's done per her report but did see an x-ray in EP[facility]?  PERTINENT HISTORY: Scoliosis S/p right CPT 04/16/23 Waiting to have left CPT done PAIN:  Are you having pain? Yes: NPRS scale: 4/10; never a 0; goes all the way up to a 10 Pain location: anterior knee Pain description: giving way; going to buckle Aggravating factors: standing Relieving factors: rest, ibuprofen  PRECAUTIONS: Fall  RED FLAGS: None   WEIGHT BEARING RESTRICTIONS: No  FALLS:  Has patient fallen in last 6 months? Yes. Number of falls 2 lost balance  OCCUPATION: currently not working   PLOF: Independent  PATIENT GOALS: try to get my knee better  NEXT MD VISIT: today and then later in May  OBJECTIVE:  Note: Objective measures were completed at Evaluation unless otherwise noted.  DIAGNOSTIC FINDINGS:  CLINICAL DATA:  Fall, right knee pain   EXAM: RIGHT KNEE - COMPLETE 4+ VIEW   COMPARISON:  Radiographs 12/30/2006   FINDINGS: No acute fracture  or dislocation. Moderate knee joint effusion. Soft tissues are radiographically unremarkable.   IMPRESSION: No acute fracture or dislocation. Moderate knee joint effusion.     PATIENT SURVEYS:  LEFS 24/80 30%  COGNITION: Overall cognitive status: Within functional limits for tasks assessed     SENSATION: WFL  EDEMA:  Noted right knee  POSTURE: rounded shoulders, forward head, and right rib hump  PALPATION: Significant rib hump right side thoracic spine  LOWER EXTREMITY ROM:  Active ROM Right eval Left eval  Hip flexion    Hip extension    Hip abduction    Hip adduction    Hip internal rotation    Hip external rotation    Knee flexion 110 144  Knee extension -6 0  Ankle dorsiflexion    Ankle  plantarflexion    Ankle inversion    Ankle eversion     (Blank rows = not tested)  LOWER EXTREMITY MMT:  MMT Right eval Left eval  Hip flexion 3-* 4+  Hip extension    Hip abduction    Hip adduction    Hip internal rotation    Hip external rotation    Knee flexion    Knee extension 3-* 5  Ankle dorsiflexion 4+ 5  Ankle plantarflexion    Ankle inversion    Ankle eversion     (Blank rows = not tested)   FUNCTIONAL TESTS:  30 seconds chair stand test x 2  Needs moderate assist for supine to sit  GAIT: Distance walked: 50 ft in PT gym  Assistive device utilized: None Level of assistance: SBA and CGA Comments: very antalgic gait especially with initial steps after standing up from sitting                                                                                                                                TREATMENT DATE:  06/06/23 Heel raises 2 x 10 Toe raises 2 x 10 Slant board 10 x 10" Seated: LAQ 1# 2 x 10 each 1# hip abduction 2 x 10 1# hip extension 2 x 10 Tandem stance 2 x 30" each; minimal use of hands 4" box step ups 2 x 10 each Sit to stand holding yellow med ball x 10 Nustep seat 6 x 5' to end treatment    05/29/23 Review of HEP and goals Seated: LAQ's 2 x 10 Heel toe raises 2 x 10 Yellow theraband hip flexion 2 x 10 each Yellow theraband hip abduction 2 x 10  Standing: Heel raises x 10 Hip abduction 2 x 10 each Hip extension 2 x 10 each Sit to stand 2 x 5 no UE assist    05/23/23 physical therapy evaluation and HEP instruction; trial of kinesiotape right knee   PATIENT EDUCATION:  Education details: Patient educated on exam findings, POC, scope of PT, HEP, and how scoliosis can effect the body.discussed bed cane to help with supine to sit Person educated: Patient  Education method: Explanation, Demonstration, and Handouts Education comprehension: verbalized understanding, returned demonstration, verbal cues required, and tactile  cues required  HOME EXERCISE PROGRAM: Access Code: [email or web address] URL: [email or web address] Date: 05/29/2023 Prepared by: [facility] - [facility]  Exercises -- Seated March with Resistance  - 2 x daily - 7 x weekly - 2 sets - 10 reps - Seated Hip Abduction with Resistance  - 2 x daily - 7 x weekly - 2 sets - 10 reps - Standing Hip Abduction with Counter Support  - 1 x daily - 7 x weekly - 2 sets - 10 reps - Standing Hip Extension with Counter Support  - 1 x daily - 7 x weekly - 2 sets - 10 reps - Sit to Stand  - 1 x daily - 7 x weekly - 2 sets - 5 reps  Access Code: [email or web address] URL: [email or web address] Date: 05/23/2023  Exercises - Supine Quad Set  - 2 x daily - 7 x weekly - 1 sets - 10 reps - 5 sec hold - Supine Heel Slide  - 2 x daily - 7 x weekly - 1 sets - 10 reps - Seated Long Arc Quad  - 2 x daily - 7 x weekly - 2 sets - 10 reps  ASSESSMENT:  CLINICAL IMPRESSION: Today's session with focus on lower extremity strengthening and mobility.   Added weight to LAQ's and hip exercises today without issue.  Added balance activity for increased challenge today. Patient able to perform step ups without upper extremity assist.  Overall good progress with therapy; needed minimal short seated rest during treatment today.    Patient will benefit from continued skilled therapy services to address deficits and promote return to optimal function.        Eval:Patient is a 48 y.o. female who was seen today for physical therapy evaluation and treatment for  Diagnosis  M25.461 (ICD-10-CM) - Effusion, right knee  M25.561,G89.29 (ICD-10-CM) - Chronic pain of right knee   Patient demonstrates muscle weakness, reduced ROM, and fascial restrictions which are likely contributing to symptoms of pain and are negatively impacting patient ability to perform ADLs and functional mobility tasks. She also demonstrates significant scoliosis effecting her functional mobility.  Patient will benefit from  skilled physical therapy services to address these deficits to reduce pain and improve level of function with ADLs and functional mobility tasks. .   OBJECTIVE IMPAIRMENTS: Abnormal gait, decreased activity tolerance, decreased knowledge of condition, decreased mobility, difficulty walking, decreased ROM, decreased strength, increased fascial restrictions, impaired perceived functional ability, postural dysfunction, and pain.   ACTIVITY LIMITATIONS: carrying, lifting, bending, sitting, standing, squatting, sleeping, stairs, transfers, bed mobility, bathing, locomotion level, and caring for others  PARTICIPATION LIMITATIONS: meal prep, cleaning, laundry, shopping, community activity, and yard work  [facility] POTENTIAL: Good  CLINICAL DECISION MAKING: Evolving/moderate complexity  EVALUATION COMPLEXITY: Moderate   GOALS: Goals reviewed with patient? No  SHORT TERM GOALS: Target date: 06/06/2023 patient will be independent with initial HEP   Baseline: Goal status: in progress  2.  Patient will report 30% improvement overall  Baseline:  Goal status:in progress   LONG TERM GOALS: Target date: 06/20/2023  Patient will be independent in self management strategies to improve quality of life and functional outcomes.  Baseline:  Goal status: in progress  2.  Patient will report 50% improvement overall  Baseline:  Goal status: in progress  3.  Patient will improve LEFS score by  16 points to demonstrate improved perceived function  Baseline: 24/80 Goal status:  in progress  4.  Patient will improve 30 sec  sit to stand score to 5  to demonstrate improved functional mobility and increased leg strength.    Baseline: 2 Goal status: in progress  5.  Patient will increase  right knee mobility to -2 to 120 to promote normal navigation of steps; step over step pattern  Baseline:  Goal status: in progress  6.  Patient will increase right leg MMT's to 4+ to 5/5 to allow navigation  of steps without gait deviation or loss of balance   Baseline: see above Goal status: in progress   PLAN:  PT FREQUENCY: 2x/week  PT DURATION: 4 weeks  PLANNED INTERVENTIONS: 97164- PT Re-evaluation, 97110-Therapeutic exercises, 97530- Therapeutic activity, 97112- Neuromuscular re-education, 97535- Self Care, 09811- Manual therapy, [phone]- Gait training, [phone]- Orthotic Fit/training, [phone]- Canalith repositioning, [id number]- Aquatic Therapy, [phone]- Splinting, Patient/Family education, Balance training, Stair training, Taping, Dry Needling, Joint mobilization, Joint manipulation, Spinal manipulation, Spinal mobilization, Scar mobilization, and DME instructions.   PLAN FOR NEXT SESSION:  right knee mobility and strength; functional strength and balance; patient with significant scoliosis that will be a limitation to her progress.    8:41 AM, 06/06/23 Taiyana Kissler Small Alma Muegge MPT [place] [facility] [place] [phone]

## 2023-06-10 ENCOUNTER — Ambulatory Visit: Admitting: Allergy

## 2023-06-11 ENCOUNTER — Encounter (HOSPITAL_COMMUNITY)

## 2023-06-13 ENCOUNTER — Encounter (HOSPITAL_COMMUNITY)

## 2023-06-14 ENCOUNTER — Encounter (HOSPITAL_COMMUNITY)

## 2023-06-17 ENCOUNTER — Ambulatory Visit: Admitting: Orthopedic Surgery

## 2023-06-18 ENCOUNTER — Encounter (HOSPITAL_COMMUNITY)

## 2023-06-20 ENCOUNTER — Encounter (HOSPITAL_COMMUNITY)

## 2023-06-21 ENCOUNTER — Ambulatory Visit: Admitting: Orthopedic Surgery

## 2023-06-21 ENCOUNTER — Ambulatory Visit: Admitting: Allergy & Immunology

## 2023-06-21 ENCOUNTER — Encounter: Payer: Self-pay | Admitting: Orthopedic Surgery

## 2023-06-21 DIAGNOSIS — R768 Other specified abnormal immunological findings in serum: Secondary | ICD-10-CM

## 2023-06-21 DIAGNOSIS — M1990 Unspecified osteoarthritis, unspecified site: Secondary | ICD-10-CM

## 2023-06-21 DIAGNOSIS — G5601 Carpal tunnel syndrome, right upper limb: Secondary | ICD-10-CM

## 2023-06-21 NOTE — Progress Notes (Signed)
   There were no vitals taken for this visit.  There is no height or weight on file to calculate BMI.  Chief Complaint  Patient presents with   Post-op Follow-up    04/16/23    Results    See labs     Encounter Diagnoses  Name Primary?   Elevated rheumatoid factor    Inflammatory arthritis Yes    DOI/DOS/ Date: 04/16/23  Improved But still has some pain with making a fist   Betsaida had her carpal tunnel release we got good results in terms of alleviating most of the numbness and tingling in her right hand she has some discomfort on the hyperthenar eminence laterally when she closes the hand into a fist  Her lab reports came back positive for all markers including RA, ANA, ANA titer etc.   Lab #1 C-reactive protein elevated lab #2 ANA elevated lab #3 uric acid elevated  Rheumatoid factor elevated  Antinuclear antibody titer elevated   She will require rheumatologic follow-up and she is agreeable to that  Return as needed

## 2023-06-21 NOTE — Progress Notes (Signed)
   There were no vitals taken for this visit.  There is no height or weight on file to calculate BMI.  Chief Complaint  Patient presents with   Post-op Follow-up    04/16/23    Results    See labs     Encounter Diagnosis  Name Primary?   Carpal tunnel syndrome of right wrist s/p release 04/16/23 Yes    DOI/DOS/ Date: 04/16/23  Improved But still has some pain with making a fist

## 2023-06-26 ENCOUNTER — Encounter (HOSPITAL_COMMUNITY)

## 2023-06-26 DIAGNOSIS — R42 Dizziness and giddiness: Secondary | ICD-10-CM | POA: Diagnosis not present

## 2023-06-30 DIAGNOSIS — I10 Essential (primary) hypertension: Secondary | ICD-10-CM | POA: Diagnosis not present

## 2023-07-01 DIAGNOSIS — D485 Neoplasm of uncertain behavior of skin: Secondary | ICD-10-CM | POA: Diagnosis not present

## 2023-07-01 DIAGNOSIS — R002 Palpitations: Secondary | ICD-10-CM | POA: Diagnosis not present

## 2023-07-01 DIAGNOSIS — I70219 Atherosclerosis of native arteries of extremities with intermittent claudication, unspecified extremity: Secondary | ICD-10-CM | POA: Diagnosis not present

## 2023-07-01 DIAGNOSIS — H9113 Presbycusis, bilateral: Secondary | ICD-10-CM | POA: Diagnosis not present

## 2023-07-01 DIAGNOSIS — R42 Dizziness and giddiness: Secondary | ICD-10-CM | POA: Diagnosis not present

## 2023-07-01 DIAGNOSIS — R1032 Left lower quadrant pain: Secondary | ICD-10-CM | POA: Diagnosis not present

## 2023-07-01 DIAGNOSIS — Z0000 Encounter for general adult medical examination without abnormal findings: Secondary | ICD-10-CM | POA: Diagnosis not present

## 2023-07-04 ENCOUNTER — Telehealth (HOSPITAL_COMMUNITY): Payer: Self-pay

## 2023-07-04 ENCOUNTER — Encounter (HOSPITAL_COMMUNITY)

## 2023-07-04 NOTE — Telephone Encounter (Signed)
 No show, called and spoke to pt who was asleep, stated she has been in a lot of pain and spasms. Therapist mentioned she hasn't returned to therapy since 06/06/23 and for maximal benefits needs to attend. Reminded next apt date and time with contact number included if needs to cancel/reschedule. Pt stated she will be at next apt.  Minor Amble, LPTA/CLT; Johnye Napoleon [phone]

## 2023-07-10 ENCOUNTER — Ambulatory Visit (HOSPITAL_COMMUNITY): Attending: Orthopedic Surgery

## 2023-07-10 DIAGNOSIS — M25561 Pain in right knee: Secondary | ICD-10-CM | POA: Insufficient documentation

## 2023-07-10 DIAGNOSIS — R262 Difficulty in walking, not elsewhere classified: Secondary | ICD-10-CM | POA: Insufficient documentation

## 2023-07-10 DIAGNOSIS — G8929 Other chronic pain: Secondary | ICD-10-CM | POA: Insufficient documentation

## 2023-07-10 DIAGNOSIS — M25461 Effusion, right knee: Secondary | ICD-10-CM | POA: Diagnosis not present

## 2023-07-10 NOTE — Therapy (Signed)
 OUTPATIENT PHYSICAL THERAPY LOWER EXTREMITY DISCHARGE PHYSICAL THERAPY DISCHARGE SUMMARY  Visits from Start of Care: 4  Current functional level related to goals / functional outcomes: See below   Remaining deficits: See below   Education / Equipment: HEP   Patient agrees to discharge. Patient goals were met. Patient is being discharged due to meeting the stated rehab goals.    Patient Name: Brittany Hood MRN: [id number] DOB:15-Dec-1975, 48 y.o., female Today's Date: 07/10/2023  END OF SESSION:  PT End of Session - 07/10/23 1105     Visit Number 4    Number of Visits 8    Date for PT Re-Evaluation 06/20/23    Authorization Type [place] [employer]    Authorization Time Period healthy blue approved 7 visits from 4/24 to 6/22    Authorization - Visit Number 3    Authorization - Number of Visits 7    Progress Note Due on Visit 7    PT Start Time 1105    PT Stop Time 1145    PT Time Calculation (min) 40 min    Activity Tolerance Patient tolerated treatment well;Patient limited by pain    Behavior During Therapy [facility] for tasks assessed/performed             Past Medical History:  Diagnosis Date   Anemia    Anxiety    Depression    Scoliosis    Vertigo    Past Surgical History:  Procedure Laterality Date   CARPAL TUNNEL RELEASE Right 04/16/2023   Procedure: CARPAL TUNNEL RELEASE;  Surgeon: Darrin Emerald, MD;  Location: [facility];  Service: Orthopedics;  Laterality: Right;   CESAREAN SECTION     x2   DIAGNOSTIC LAPAROSCOPY     TUBAL LIGATION     Patient Active Problem List   Diagnosis Date Noted   Carpal tunnel syndrome of right wrist 04/16/2023   Migraine headache 11/27/2006   SYMPTOM, COUGH 11/27/2006    PCP: sees them for the first time in July   REFERRING PROVIDER: Darrin Emerald, MD  REFERRING DIAG:  Diagnosis  M25.461 (ICD-10-CM) - Effusion, right knee  M25.561,G89.29 (ICD-10-CM) - Chronic pain of right knee    THERAPY DIAG:   Chronic pain of right knee  Difficulty in walking, not elsewhere classified  Effusion, right knee  Rationale for Evaluation and Treatment: Rehabilitation  ONSET DATE: 1.5 years   SUBJECTIVE:   SUBJECTIVE STATEMENT: She recently found out she has lupus and RA; will be seeing a specialist August 1st  Eval: Saw Phyllis Breeze back in March; drew fluid off; felt better but then the pain and fluid came back; so referred to therapy.  She has fallen a couple of times onto right knee and thinks that may have started it. No x-ray's done per her report but did see an x-ray in [facility]?  PERTINENT HISTORY: Scoliosis S/p right CPT 04/16/23 Waiting to have left CPT done PAIN:  Are you having pain? Yes: NPRS scale: 4/10; never a 0; goes all the way up to a 10 Pain location: anterior knee Pain description: giving way; going to buckle Aggravating factors: standing Relieving factors: rest, ibuprofen  PRECAUTIONS: Fall  RED FLAGS: None   WEIGHT BEARING RESTRICTIONS: No  FALLS:  Has patient fallen in last 6 months? Yes. Number of falls 2 lost balance  OCCUPATION: currently not working   PLOF: Independent  PATIENT GOALS: try to get my knee better  NEXT MD VISIT: today and then later in May  OBJECTIVE:  Note: Objective measures were completed at Evaluation unless otherwise noted.  DIAGNOSTIC FINDINGS:  CLINICAL DATA:  Fall, right knee pain   EXAM: RIGHT KNEE - COMPLETE 4+ VIEW   COMPARISON:  Radiographs 12/30/2006   FINDINGS: No acute fracture or dislocation. Moderate knee joint effusion. Soft tissues are radiographically unremarkable.   IMPRESSION: No acute fracture or dislocation. Moderate knee joint effusion.     PATIENT SURVEYS:  LEFS 24/80 30%  COGNITION: Overall cognitive status: Within functional limits for tasks assessed     SENSATION: WFL  EDEMA:  Noted right knee  POSTURE: rounded shoulders, forward head, and right rib hump  PALPATION: Significant rib hump  right side thoracic spine  LOWER EXTREMITY ROM:  Active ROM Right eval Left eval Right 07/10/23  Hip flexion     Hip extension     Hip abduction     Hip adduction     Hip internal rotation     Hip external rotation     Knee flexion 110 144 130  Knee extension -6 0 0  Ankle dorsiflexion     Ankle plantarflexion     Ankle inversion     Ankle eversion      (Blank rows = not tested)  LOWER EXTREMITY MMT:  MMT Right eval Left eval Right 07/10/23  Hip flexion 3-* 4+ 4+  Hip extension     Hip abduction     Hip adduction     Hip internal rotation     Hip external rotation     Knee flexion     Knee extension 3-* 5 5  Ankle dorsiflexion 4+ 5 5  Ankle plantarflexion     Ankle inversion     Ankle eversion      (Blank rows = not tested)   FUNCTIONAL TESTS:  30 seconds chair stand test x 2  Needs moderate assist for supine to sit  GAIT: Distance walked: 50 ft in PT gym  Assistive device utilized: None Level of assistance: SBA and CGA Comments: very antalgic gait especially with initial steps after standing up from sitting                                                                                                                                TREATMENT DATE:  07/10/23 Nustep seat 6 x 5' dynamic warm up Progress note 30 sec sit to stand test 9x LEFS 39/80 48.8% AROM and MMT's see above  06/06/23 Heel raises 2 x 10 Toe raises 2 x 10 Slant board 10 x 10 Seated: LAQ 1# 2 x 10 each 1# hip abduction 2 x 10 1# hip extension 2 x 10 Tandem stance 2 x 30 each; minimal use of hands 4 box step ups 2 x 10 each Sit to stand holding yellow med ball x 10 Nustep seat 6 x 5' to end treatment    05/29/23 Review of HEP and goals Seated: LAQ's 2 x  10 Heel toe raises 2 x 10 Yellow theraband hip flexion 2 x 10 each Yellow theraband hip abduction 2 x 10  Standing: Heel raises x 10 Hip abduction 2 x 10 each Hip extension 2 x 10 each Sit to stand 2 x 5 no UE  assist    05/23/23 physical therapy evaluation and HEP instruction; trial of kinesiotape right knee   PATIENT EDUCATION:  Education details: Patient educated on exam findings, POC, scope of PT, HEP, and how scoliosis can effect the body.discussed bed cane to help with supine to sit Person educated: Patient Education method: Explanation, Demonstration, and Handouts Education comprehension: verbalized understanding, returned demonstration, verbal cues required, and tactile cues required  HOME EXERCISE PROGRAM: Access Code: [facility] URL: [email or web address] Date: 05/29/2023 Prepared by: [facility] - [facility]hab  Exercises -- Seated March with Resistance  - 2 x daily - 7 x weekly - 2 sets - 10 reps - Seated Hip Abduction with Resistance  - 2 x daily - 7 x weekly - 2 sets - 10 reps - Standing Hip Abduction with Counter Support  - 1 x daily - 7 x weekly - 2 sets - 10 reps - Standing Hip Extension with Counter Support  - 1 x daily - 7 x weekly - 2 sets - 10 reps - Sit to Stand  - 1 x daily - 7 x weekly - 2 sets - 5 reps  Access Code: [email or web address] URL: [email or web address] Date: 05/23/2023  Exercises - Supine Quad Set  - 2 x daily - 7 x weekly - 1 sets - 10 reps - 5 sec hold - Supine Heel Slide  - 2 x daily - 7 x weekly - 1 sets - 10 reps - Seated Long Arc Quad  - 2 x daily - 7 x weekly - 2 sets - 10 reps  ASSESSMENT:  CLINICAL IMPRESSION: Progress note today.     Patient has met all set rehab goals and is agreeable to discharge at this time.       Eval:Patient is a 48 y.o. female who was seen today for physical therapy evaluation and treatment for  Diagnosis  M25.461 (ICD-10-CM) - Effusion, right knee  M25.561,G89.29 (ICD-10-CM) - Chronic pain of right knee   Patient demonstrates muscle weakness, reduced ROM, and fascial restrictions which are likely contributing to symptoms of pain and are negatively impacting patient ability to perform ADLs and functional  mobility tasks. She also demonstrates significant scoliosis effecting her functional mobility.  Patient will benefit from skilled physical therapy services to address these deficits to reduce pain and improve level of function with ADLs and functional mobility tasks. .   OBJECTIVE IMPAIRMENTS: Abnormal gait, decreased activity tolerance, decreased knowledge of condition, decreased mobility, difficulty walking, decreased ROM, decreased strength, increased fascial restrictions, impaired perceived functional ability, postural dysfunction, and pain.   ACTIVITY LIMITATIONS: carrying, lifting, bending, sitting, standing, squatting, sleeping, stairs, transfers, bed mobility, bathing, locomotion level, and caring for others  PARTICIPATION LIMITATIONS: meal prep, cleaning, laundry, shopping, community activity, and yard work  [facility] POTENTIAL: Good  CLINICAL DECISION MAKING: Evolving/moderate complexity  EVALUATION COMPLEXITY: Moderate   GOALS: Goals reviewed with patient? No  SHORT TERM GOALS: Target date: 06/06/2023 patient will be independent with initial HEP   Baseline: Goal status: met  2.  Patient will report 30% improvement overall  Baseline:  Goal status:met   LONG TERM GOALS: Target date: 06/20/2023  Patient will be independent in self management strategies to improve quality of life and functional outcomes.  Baseline:  Goal status: met  2.  Patient will report 50% improvement overall  Baseline:  Goal status:met  3.  Patient will improve LEFS score by  16 points to demonstrate improved perceived function  Baseline: 24/80 Goal status:met   4.  Patient will improve 30 sec  sit to stand score to 5  to demonstrate improved functional mobility and increased leg strength.    Baseline: 2 Goal status:met  5.  Patient will increase  right knee mobility to -2 to 120 to promote normal navigation of steps; step over step pattern  Baseline:  Goal status: met  6.  Patient  will increase right leg MMT's to 4+ to 5/5 to allow navigation of steps without gait deviation or loss of balance   Baseline: see above Goal status: met   PLAN:  PT FREQUENCY: 2x/week  PT DURATION: 4 weeks  PLANNED INTERVENTIONS: 97164- PT Re-evaluation, 97110-Therapeutic exercises, 97530- Therapeutic activity, 97112- Neuromuscular re-education, 97535- Self Care, 16109- Manual therapy, [phone]- Gait training, [phone]- Orthotic Fit/training, [phone]- Canalith repositioning, [id number]- Aquatic Therapy, [phone]- Splinting, Patient/Family education, Balance training, Stair training, Taping, Dry Needling, Joint mobilization, Joint manipulation, Spinal manipulation, Spinal mobilization, Scar mobilization, and DME instructions.   PLAN FOR NEXT SESSION:  discharge to I HEP   11:05 AM, 07/10/23 Benard Minturn Small Dastan Krider MPT  physical therapy [place] [phone]

## 2023-07-12 DIAGNOSIS — R1032 Left lower quadrant pain: Secondary | ICD-10-CM | POA: Diagnosis not present

## 2023-07-22 DIAGNOSIS — R1032 Left lower quadrant pain: Secondary | ICD-10-CM | POA: Diagnosis not present

## 2023-07-24 ENCOUNTER — Telehealth: Payer: Self-pay | Admitting: Orthopedic Surgery

## 2023-07-24 DIAGNOSIS — E039 Hypothyroidism, unspecified: Secondary | ICD-10-CM | POA: Diagnosis not present

## 2023-07-24 DIAGNOSIS — M810 Age-related osteoporosis without current pathological fracture: Secondary | ICD-10-CM | POA: Diagnosis not present

## 2023-07-24 DIAGNOSIS — E7800 Pure hypercholesterolemia, unspecified: Secondary | ICD-10-CM | POA: Diagnosis not present

## 2023-07-24 DIAGNOSIS — Z131 Encounter for screening for diabetes mellitus: Secondary | ICD-10-CM | POA: Diagnosis not present

## 2023-07-24 DIAGNOSIS — R5383 Other fatigue: Secondary | ICD-10-CM | POA: Diagnosis not present

## 2023-07-24 DIAGNOSIS — Z7289 Other problems related to lifestyle: Secondary | ICD-10-CM | POA: Diagnosis not present

## 2023-07-24 DIAGNOSIS — Z79899 Other long term (current) drug therapy: Secondary | ICD-10-CM | POA: Diagnosis not present

## 2023-07-24 NOTE — Telephone Encounter (Signed)
 DR. MARGRETTE   Patient came in the office and requesting a note for her case manager that she can't work until she goes to the doctor you are referring her to.  Her case manger has dropped her from getting food stamps.  Please call her back to discuss [phone]

## 2023-07-25 ENCOUNTER — Encounter: Payer: Self-pay | Admitting: Orthopedic Surgery

## 2023-07-25 DIAGNOSIS — E1165 Type 2 diabetes mellitus with hyperglycemia: Secondary | ICD-10-CM | POA: Diagnosis not present

## 2023-08-01 DIAGNOSIS — R1032 Left lower quadrant pain: Secondary | ICD-10-CM | POA: Diagnosis not present

## 2023-08-01 DIAGNOSIS — E1165 Type 2 diabetes mellitus with hyperglycemia: Secondary | ICD-10-CM | POA: Diagnosis not present

## 2023-08-09 ENCOUNTER — Ambulatory Visit: Admitting: Allergy & Immunology

## 2023-08-09 ENCOUNTER — Encounter: Payer: Self-pay | Admitting: Allergy & Immunology

## 2023-08-09 VITALS — BP 130/88 | HR 93 | Temp 98.3°F | Resp 18 | Ht 62.0 in | Wt 186.0 lb

## 2023-08-09 DIAGNOSIS — T50905A Adverse effect of unspecified drugs, medicaments and biological substances, initial encounter: Secondary | ICD-10-CM

## 2023-08-09 DIAGNOSIS — J310 Chronic rhinitis: Secondary | ICD-10-CM | POA: Diagnosis not present

## 2023-08-09 DIAGNOSIS — Z888 Allergy status to other drugs, medicaments and biological substances status: Secondary | ICD-10-CM

## 2023-08-09 DIAGNOSIS — L299 Pruritus, unspecified: Secondary | ICD-10-CM | POA: Diagnosis not present

## 2023-08-09 NOTE — Progress Notes (Signed)
 NEW PATIENT  Date of Service/Encounter:  08/09/23  Consult requested by: Patient, No Pcp Per   Assessment:   Chronic rhinitis - planning for skin testing at the next visit  Adverse effect of drug (penicillin)  Pruritus  Plan/Recommendations:   1. Chronic rhinitis - Because of insurance stipulations, we cannot do skin testing on the same day as your first visit. - We are all working to fight this, but for now we need to do two separate visits.  - We will know more after we do testing at the next visit.  - The skin testing visit can be squeezed in at your convenience.  - Then we can make a more full plan to address all of your symptoms. - Be sure to stop your antihistamines for 3 days before this appointment.   2. Chronic itching - We can get some labs to look for serious causes of itching. - The testing might reveal some triggers of your symptoms. - We are going to change your regimen around.   3. Adverse effect of drug (penicillin) - We will schedule you for skin testing and a penicillin challenge. - This will take around 3-4 hours.  4. Return in about 1 year (around 08/08/2024) for SKIN TESTING (1-68). You can have the follow up appointment with Dr. Iva or a Nurse Practicioner (our Nurse Practitioners are excellent and always have Physician oversight!).   This note in its entirety was forwarded to the Provider who requested this consultation.  Subjective:   Brittany Hood is a 48 y.o. female presenting today for evaluation of  Chief Complaint  Patient presents with   Allergic Rhinitis    Pruritus    When she steps in the grass     Brittany Hood has a history of the following: Patient Active Problem List   Diagnosis Date Noted   Carpal tunnel syndrome of right wrist 04/16/2023   Migraine headache 11/27/2006   SYMPTOM, COUGH 11/27/2006    History obtained from: chart review and patient and her husband.  Discussed the use of AI scribe software  for clinical note transcription with the patient and/or guardian, who gave verbal consent to proceed.  Brittany Hood was referred by Patient, No Pcp Per.     Brittany Hood is a 48 y.o. female presenting for an evaluation of allergies, including environmental allergies nad drug allergies.   Asthma/Respiratory Symptom History: She denies being a smoker, drinker, or drug user and has never been diagnosed with asthma, though she experiences occasional breathing difficulties attributed to severe scoliosis, which affects her chest and lung expansion. She has never had an inhaler at all.   Allergic Rhinitis Symptom History: She experiences seasonal itching, particularly during certain times of the year, and uses Benadryl to manage symptoms. She sometimes takes up to three Benadryl tablets at once to alleviate hives, though she has not needed to do so in the past six to eight months. She has a history of itching when stepping on grass during childhood but has never been formally tested for allergies.  Food Allergy Symptom History: She recalls an incident at a [place] buffet where she experienced throat swelling after consuming seafood, though she cannot remember the specific type. She has since avoided similar foods. She is eating everything anyway. She just never went to that restaurant any longer. She has not had an EpiPen at this point in time.   Infection Symptom History: She reports a history of sinus infections, though the frequency is unclear. She  uses Benadryl for allergy symptoms but has not tried other antihistamines like Zyrtec or Allegra.   Drug Allergy Symptom History: In 1995, she experienced a reaction to penicillin, which included symptoms of throat tightness and nausea. Since then, she has avoided penicillin and medications containing it. She is concerned about her potential allergy to penicillin and wishes to confirm it.  Otherwise, there is no history of other atopic diseases, including  asthma, stinging insect allergies, eczema, urticaria, or contact dermatitis. There is no significant infectious history. Vaccinations are up to date.    Past Medical History: Patient Active Problem List   Diagnosis Date Noted   Carpal tunnel syndrome of right wrist 04/16/2023   Migraine headache 11/27/2006   SYMPTOM, COUGH 11/27/2006    Medication List:  Allergies as of 08/09/2023       Reactions   Penicillins Hives, Nausea And Vomiting        Medication List        Accurate as of August 09, 2023 10:18 AM. If you have any questions, ask your nurse or doctor.          amitriptyline 25 MG tablet Commonly known as: ELAVIL Take 25 mg by mouth at bedtime.   GaviLyte-N with Flavor Pack 420 g solution Generic drug: polyethylene glycol-electrolytes Take by mouth.   megestrol  40 MG tablet Commonly known as: MEGACE  24 days on 4 days off   naproxen  500 MG tablet Commonly known as: NAPROSYN  Take 1 tablet (500 mg total) by mouth 2 (two) times daily.   predniSONE  10 MG tablet Commonly known as: DELTASONE  Take 1 tablet (10 mg total) by mouth 3 (three) times daily.   tiZANidine  4 MG tablet Commonly known as: Zanaflex  Take 1 tablet (4 mg total) by mouth every 6 (six) hours as needed for muscle spasms.        Birth History: non-contributory  Developmental History: non-contributory  Past Surgical History: Past Surgical History:  Procedure Laterality Date   CARPAL TUNNEL RELEASE Right 04/16/2023   Procedure: CARPAL TUNNEL RELEASE;  Surgeon: Margrette Taft BRAVO, MD;  Location: [facility];  Service: Orthopedics;  Laterality: Right;   CESAREAN SECTION     x2   DIAGNOSTIC LAPAROSCOPY     TUBAL LIGATION       Family History: Family History  Problem Relation Age of Onset   Heart attack Mother    Kidney failure Maternal Grandmother    Heart attack Maternal Grandfather    Asthma Son    Allergic rhinitis Son      Social History: Brittany Hood lives at home with her family.   She lives in an apartment that is 49 years old.  There is carpeting The apartment.  The electric heating and central cooling.  There are 2 small dogs in the home.  There are no dust mite covers on the bedding.  There is no tobacco exposure.    Review of systems otherwise negative other than that mentioned in the HPI.    Objective:   Blood pressure 130/88, pulse 93, temperature 98.3 F (36.8 C), resp. rate 18, height 5' 2 (1.575 m), weight 186 lb (84.4 kg), SpO2 98%. Body mass index is 34.02 kg/m.     Physical Exam Vitals reviewed.  Constitutional:      Appearance: She is well-developed.     Comments: Amusing.  Lots of eye rolling.  HENT:     Head: Normocephalic and atraumatic.     Right Ear: Tympanic membrane, ear canal and external ear  normal. No drainage, swelling or tenderness. Tympanic membrane is not injected, scarred, erythematous, retracted or bulging.     Left Ear: Tympanic membrane, ear canal and external ear normal. No drainage, swelling or tenderness. Tympanic membrane is not injected, scarred, erythematous, retracted or bulging.     Nose: No nasal deformity, septal deviation, mucosal edema or rhinorrhea.     Right Turbinates: Enlarged, swollen and pale.     Left Turbinates: Enlarged, swollen and pale.     Right Sinus: No maxillary sinus tenderness or frontal sinus tenderness.     Left Sinus: No maxillary sinus tenderness or frontal sinus tenderness.     Mouth/Throat:     Lips: Pink.     Mouth: Mucous membranes are moist. Mucous membranes are not pale and not dry.     Pharynx: Uvula midline.     Comments: Mild cobblestoning. Eyes:     General: Allergic shiner present.        Right eye: No discharge.        Left eye: No discharge.     Conjunctiva/sclera: Conjunctivae normal.     Right eye: Right conjunctiva is not injected. No chemosis.    Left eye: Left conjunctiva is not injected. No chemosis.    Pupils: Pupils are equal, round, and reactive to light.   Cardiovascular:     Rate and Rhythm: Normal rate and regular rhythm.     Heart sounds: Normal heart sounds.  Pulmonary:     Effort: Pulmonary effort is normal. No tachypnea, accessory muscle usage or respiratory distress.     Breath sounds: Normal breath sounds. No wheezing, rhonchi or rales.     Comments: Moving air well in all lung fields.  No wheezing or crackles noted. Chest:     Chest wall: No tenderness.  Abdominal:     Tenderness: There is no abdominal tenderness. There is no guarding or rebound.  Lymphadenopathy:     Head:     Right side of head: No submandibular, tonsillar or occipital adenopathy.     Left side of head: No submandibular, tonsillar or occipital adenopathy.     Cervical: No cervical adenopathy.  Skin:    Coloration: Skin is not pale.     Findings: No abrasion, erythema, petechiae or rash. Rash is not papular, urticarial or vesicular.  Neurological:     Mental Status: She is alert.  Psychiatric:        Behavior: Behavior is cooperative.      Diagnostic studies: deferred due to insurance stipulations that require a separate visit for testing         Marty Shaggy, MD [facility] 

## 2023-08-09 NOTE — Patient Instructions (Addendum)
 1. Chronic rhinitis - Because of insurance stipulations, we cannot do skin testing on the same day as your first visit. - We are all working to fight this, but for now we need to do two separate visits.  - We will know more after we do testing at the next visit.  - The skin testing visit can be squeezed in at your convenience.  - Then we can make a more full plan to address all of your symptoms. - Be sure to stop your antihistamines for 3 days before this appointment.   2. Chronic itching - We can get some labs to look for serious causes of itching. - The testing might reveal some triggers of your symptoms. - We are going to change your regimen around.   3. Adverse effect of drug (penicillin) - We will schedule you for skin testing and a penicillin challenge. - This will take around 3-4 hours.  4. Return in about 1 year (around 08/08/2024) for SKIN TESTING (1-68). You can have the follow up appointment with Dr. Iva or a Nurse Practicioner (our Nurse Practitioners are excellent and always have Physician oversight!).    Please inform us  of any Emergency Department visits, hospitalizations, or changes in symptoms. Call us  before going to the ED for breathing or allergy symptoms since we might be able to fit you in for a sick visit. Feel free to contact us  anytime with any questions, problems, or concerns.  It was a pleasure to re-meet you today!  Websites that have reliable patient information: 1. American Academy of Asthma, Allergy, and Immunology: www.aaaai.org 2. Food Allergy Research and Education (FARE): foodallergy.org 3. Mothers of Asthmatics: http://www.asthmacommunitynetwork.org 4. American College of Allergy, Asthma, and Immunology: www.acaai.org      "Like" us  on Facebook and Instagram for our latest updates!      A healthy democracy works best when [employer] participate! Make sure you are registered to vote! If you have moved or changed any of your contact  information, you will need to get this updated before voting! Scan the QR codes below to learn more!

## 2023-08-28 ENCOUNTER — Ambulatory Visit: Admitting: Family Medicine

## 2023-08-30 ENCOUNTER — Ambulatory Visit: Admitting: Allergy & Immunology

## 2023-08-30 DIAGNOSIS — R768 Other specified abnormal immunological findings in serum: Secondary | ICD-10-CM | POA: Diagnosis not present

## 2023-08-30 LAB — HM HEPATITIS C SCREENING LAB: HM Hepatitis Screen: NEGATIVE

## 2023-09-04 ENCOUNTER — Ambulatory Visit (INDEPENDENT_AMBULATORY_CARE_PROVIDER_SITE_OTHER): Admitting: Allergy & Immunology

## 2023-09-04 ENCOUNTER — Encounter: Payer: Self-pay | Admitting: Allergy & Immunology

## 2023-09-04 DIAGNOSIS — L299 Pruritus, unspecified: Secondary | ICD-10-CM

## 2023-09-04 DIAGNOSIS — J3089 Other allergic rhinitis: Secondary | ICD-10-CM | POA: Diagnosis not present

## 2023-09-04 DIAGNOSIS — J302 Other seasonal allergic rhinitis: Secondary | ICD-10-CM

## 2023-09-04 MED ORDER — AMOXICILLIN 250 MG/5ML PO SUSR: 250.0000 mg | Freq: Three times a day (TID) | ORAL | 0 refills | Status: DC

## 2023-09-04 MED ORDER — MONTELUKAST SODIUM 10 MG PO TABS
10.0000 mg | ORAL_TABLET | Freq: Every day | ORAL | 5 refills | Status: AC
Start: 2023-09-04 — End: ?

## 2023-09-04 NOTE — Patient Instructions (Signed)
 1. Chronic rhinitis - Testing today showed: grasses, weeds, trees, indoor molds, outdoor molds, dust mites, cat, dog, and cockroach - Copy of test results provided.  - Avoidance measures provided. - Stop taking:  - Continue with: Singulair  (montelukast ) 10mg  daily and Benadryl (since you won't give it up!) - You can use an extra dose of the antihistamine, if needed, for breakthrough symptoms.  - Consider nasal saline rinses 1-2 times daily to remove allergens from the nasal cavities as well as help with mucous clearance (this is especially helpful to do before the nasal sprays are given) - STRONGLY consider allergy  shots as a means of long-term control. - Allergy  shots re-train and reset the immune system to ignore environmental allergens and decrease the resulting immune response to those allergens (sneezing, itchy watery eyes, runny nose, nasal congestion, etc).    - Allergy  shots improve symptoms in 75-85% of patients.   2. Chronic itching - We are going to get some additional labs to look for serious causes of itching. - You have already had a good workup from your Rheumatology Team.   3. Adverse effect of drug (penicillin) - We will schedule you for skin testing and a penicillin challenge. - This will take around 3-4 hours.  4. Return in about 4 weeks (around 10/02/2023) for PENICILLIN TESTING AND CHALLENGE. You can have the follow up appointment with Dr. Iva or a Nurse Practicioner (our Nurse Practitioners are excellent and always have Physician oversight!).    Please inform us  of any Emergency Department visits, hospitalizations, or changes in symptoms. Call us  before going to the ED for breathing or allergy  symptoms since we might be able to fit you in for a sick visit. Feel free to contact us  anytime with any questions, problems, or concerns.  It was a pleasure to re-meet you today!  Websites that have reliable patient information: 1. American Academy of Asthma, Allergy ,  and Immunology: www.aaaai.org 2. Food Allergy  Research and Education (FARE): foodallergy.org 3. Mothers of Asthmatics: http://www.asthmacommunitynetwork.org 4. American College of Allergy , Asthma, and Immunology: www.acaai.org      "Like" us  on Facebook and Instagram for our latest updates!      A healthy democracy works best when [employer] participate! Make sure you are registered to vote! If you have moved or changed any of your contact information, you will need to get this updated before voting! Scan the QR codes below to learn more!       Airborne Adult Perc - 09/04/23 0925     Time Antigen Placed 9074    Allergen Manufacturer Jestine    Location Back    Number of Test 55    Panel 1 Select    1. Control-Buffer 50% Glycerol Negative    2. Control-Histamine 2+    3. Bahia Negative    4. French [place] Negative    5. Johnson 2+    6. [place]  Blue Negative    7. Meadow Fescue 2+    8. Perennial Rye 2+    9. Timothy Negative    10. Ragweed Mix Negative    11. Cocklebur 2+    12. Plantain,  English 3+    13. Baccharis 2+    14. Dog Fennel Negative    15. Russian Thistle Negative    16. Lamb's Quarters 2+    17. Sheep Sorrell 2+    18. Rough Pigweed 2+    19. Marsh Elder, Rough 2+    20. Mugwort, Common Negative  21. Box, Elder Negative    22. Cedar, red 2+    23. Sweet Gum 2+    24. Pecan Pollen 2+    25. Pine Mix 3+    26. Walnut, Black Pollen 3+    27. Red Mulberry Negative    28. Ash Mix 2+    29. Birch Mix 3+    30. Beech American Negative    31. Cottonwood, Guinea-Bissau Negative    32. Hickory, White Negative    33. Maple Mix 2+    34. Oak, [place] Mix 3+    35. Sycamore Eastern 3+    36. Alternaria Alternata Negative    37. Cladosporium Herbarum Negative    38. Aspergillus Mix Negative    39. Penicillium Mix 2+    40. Bipolaris Sorokiniana (Helminthosporium) 2+    41. Drechslera Spicifera (Curvularia) Negative    42. Mucor Plumbeus Negative    43.  Fusarium Moniliforme 2+    44. Aureobasidium Pullulans (pullulara) Negative    45. Rhizopus Oryzae Negative    46. Botrytis Cinera 2+    47. Epicoccum Nigrum 2+    48. Phoma Betae Negative    49. Dust Mite Mix Negative    50. Cat Hair 10,000 BAU/ml 2+    51.  Dog Epithelia 3+    52. Mixed Feathers Negative    53. Horse Epithelia Negative    54. Cockroach, German Negative    55. Tobacco Leaf Negative          13 Food Perc - 09/04/23 0926       Test Information   Allergen Manufacturer Jestine    Location Back    Number of allergen test 13    Food Select      Food   1. Peanut Negative    2. Soybean Negative    3. Wheat Negative    4. Sesame Negative    5. Milk, Cow Negative    6. Casein Negative    7. Egg White, Chicken Negative    8. Shellfish Mix Negative    9. Fish Mix Negative    10. Cashew Negative    11. Walnut Food Negative    12. Almond Negative    13. Hazelnut Negative          Intradermal - 09/04/23 1014     Time Antigen Placed 1015    Allergen Manufacturer Greer    Location Arm    Number of Test 7    Control Negative    Bahia Negative    [place] Negative    Ragweed Mix Negative    Mold 1 Negative    Mite Mix 4+    Cockroach 3+          Reducing Pollen Exposure  The American Academy of Allergy , Asthma and Immunology suggests the following steps to reduce your exposure to pollen during allergy  seasons.    Do not hang sheets or clothing out to dry; pollen may collect on these items. Do not mow lawns or spend time around freshly cut grass; mowing stirs up pollen. Keep windows closed at night.  Keep car windows closed while driving. Minimize morning activities outdoors, a time when pollen counts are usually at their highest. Stay indoors as much as possible when pollen counts or humidity is high and on windy days when pollen tends to remain in the air longer. Use air conditioning when possible.  Many air conditioners have filters that trap the  pollen spores.  Use a HEPA room air filter to remove pollen form the indoor air you breathe.  Control of Mold Allergen   Mold and fungi can grow on a variety of surfaces provided certain temperature and moisture conditions exist.  Outdoor molds grow on plants, decaying vegetation and soil.  The major outdoor mold, Alternaria and Cladosporium, are found in very high numbers during hot and dry conditions.  Generally, a late Summer - Fall peak is seen for common outdoor fungal spores.  Rain will temporarily lower outdoor mold spore count, but counts rise rapidly when the rainy period ends.  The most important indoor molds are Aspergillus and Penicillium.  Dark, humid and poorly ventilated basements are ideal sites for mold growth.  The next most common sites of mold growth are the bathroom and the kitchen.  Outdoor (Seasonal) Mold Control   Use air conditioning and keep windows closed Avoid exposure to decaying vegetation. Avoid leaf raking. Avoid grain handling. Consider wearing a face mask if working in moldy areas.    Indoor (Perennial) Mold Control    Maintain humidity below 50%. Clean washable surfaces with 5% bleach solution. Remove sources e.g. contaminated carpets.    Control of Dust Mite Allergen    Dust mites play a major role in allergic asthma and rhinitis.  They occur in environments with high humidity wherever human skin is found.  Dust mites absorb humidity from the atmosphere (ie, they do not drink) and feed on organic matter (including shed human and animal skin).  Dust mites are a microscopic type of insect that you cannot see with the naked eye.  High levels of dust mites have been detected from mattresses, pillows, carpets, upholstered furniture, bed covers, clothes, soft toys and any woven material.  The principal allergen of the dust mite is found in its feces.  A gram of dust may contain 1,000 mites and 250,000 fecal particles.  Mite antigen is easily measured in the  air during house cleaning activities.  Dust mites do not bite and do not cause harm to humans, other than by triggering allergies/asthma.    Ways to decrease your exposure to dust mites in your home:  Encase mattresses, box springs and pillows with a mite-impermeable barrier or cover   Wash sheets, blankets and drapes weekly in hot water (130 F) with detergent and dry them in a dryer on the hot setting.  Have the room cleaned frequently with a vacuum cleaner and a damp dust-mop.  For carpeting or rugs, vacuuming with a vacuum cleaner equipped with a high-efficiency particulate air (HEPA) filter.  The dust mite allergic individual should not be in a room which is being cleaned and should wait 1 hour after cleaning before going into the room. Do not sleep on upholstered furniture (eg, couches).   If possible removing carpeting, upholstered furniture and drapery from the home is ideal.  Horizontal blinds should be eliminated in the rooms where the person spends the most time (bedroom, study, television room).  Washable vinyl, roller-type shades are optimal. Remove all non-washable stuffed toys from the bedroom.  Wash stuffed toys weekly like sheets and blankets above.   Reduce indoor humidity to less than 50%.  Inexpensive humidity monitors can be purchased at most hardware stores.  Do not use a humidifier as can make the problem worse and are not recommended.  Control of Dog or Cat Allergen  Avoidance is the best way to manage a dog or cat allergy . If you have a dog or  cat and are allergic to dog or cats, consider removing the dog or cat from the home. If you have a dog or cat but don't want to find it a new home, or if your family wants a pet even though someone in the household is allergic, here are some strategies that may help keep symptoms at bay:  Keep the pet out of your bedroom and restrict it to only a few rooms. Be advised that keeping the dog or cat in only one room will not limit the  allergens to that room. Don't pet, hug or kiss the dog or cat; if you do, wash your hands with soap and water. High-efficiency particulate air (HEPA) cleaners run continuously in a bedroom or living room can reduce allergen levels over time. Regular use of a high-efficiency vacuum cleaner or a central vacuum can reduce allergen levels. Giving your dog or cat a bath at least once a week can reduce airborne allergen.  Allergy  Shots  Allergies are the result of a chain reaction that starts in the immune system. Your immune system controls how your body defends itself. For instance, if you have an allergy  to pollen, your immune system identifies pollen as an invader or allergen. Your immune system overreacts by producing antibodies called Immunoglobulin E (IgE). These antibodies travel to cells that release chemicals, causing an allergic reaction.  The concept behind allergy  immunotherapy, whether it is received in the form of shots or tablets, is that the immune system can be desensitized to specific allergens that trigger allergy  symptoms. Although it requires time and patience, the payback can be long-term relief. Allergy  injections contain a dilute solution of those substances that you are allergic to based upon your skin testing and allergy  history.   How Do Allergy  Shots Work?  Allergy  shots work much like a vaccine. Your body responds to injected amounts of a particular allergen given in increasing doses, eventually developing a resistance and tolerance to it. Allergy  shots can lead to decreased, minimal or no allergy  symptoms.  There generally are two phases: build-up and maintenance. Build-up often ranges from three to six months and involves receiving injections with increasing amounts of the allergens. The shots are typically given once or twice a week, though more rapid build-up schedules are sometimes used.  The maintenance phase begins when the most effective dose is reached. This dose is  different for each person, depending on how allergic you are and your response to the build-up injections. Once the maintenance dose is reached, there are longer periods between injections, typically two to four weeks.  Occasionally doctors give cortisone-type shots that can temporarily reduce allergy  symptoms. These types of shots are different and should not be confused with allergy  immunotherapy shots.  Who Can Be Treated with Allergy  Shots?  Allergy  shots may be a good treatment approach for people with allergic rhinitis (hay fever), allergic asthma, conjunctivitis (eye allergy ) or stinging insect allergy .   Before deciding to begin allergy  shots, you should consider:   The length of allergy  season and the severity of your symptoms  Whether medications and/or changes to your environment can control your symptoms  Your desire to avoid long-term medication use  Time: allergy  immunotherapy requires a major time commitment  Cost: may vary depending on your insurance coverage  Allergy  shots for children age 48 and older are effective and often well tolerated. They might prevent the onset of new allergen sensitivities or the progression to asthma.  Allergy  shots are not started  on patients who are pregnant but can be continued on patients who become pregnant while receiving them. In some patients with other medical conditions or who take certain common medications, allergy  shots may be of risk. It is important to mention other medications you talk to your allergist.   What are the two types of build-ups offered:   RUSH or Rapid Desensitization -- one day of injections lasting from 8:30-4:30pm, injections every 1 hour.  Approximately half of the build-up process is completed in that one day.  The following week, normal build-up is resumed, and this entails ~16 visits either weekly or twice weekly, until reaching your "maintenance dose" which is continued weekly until eventually getting spaced  out to every month for a duration of 3 to 5 years. The regular build-up appointments are nurse visits where the injections are administered, followed by required monitoring for 30 minutes.    Traditional build-up -- weekly visits for 6 -12 months until reaching "maintenance dose", then continue weekly until eventually spacing out to every 4 weeks as above. At these appointments, the injections are administered, followed by required monitoring for 30 minutes.     Either way is acceptable, and both are equally effective. With the rush protocol, the advantage is that less time is spent here for injections overall AND you would also reach maintenance dosing faster (which is when the clinical benefit starts to become more apparent). Not everyone is a candidate for rapid desensitization.   IF we proceed with the RUSH protocol, there are premedications which must be taken the day before and the day after the rush only (this includes antihistamines, steroids, and Singulair ).  After the rush day, no prednisone  or Singulair  is required, and we just recommend antihistamines taken on your injection day.  What Is An Estimate of the Costs?  If you are interested in starting allergy  injections, please check with your insurance company about your coverage for both allergy  vial sets and allergy  injections.  Please do so prior to making the appointment to start injections.  The following are CPT codes to give to your insurance company. These are the amounts we BILL to the insurance company, but the amount YOU WILL PAY and WE RECEIVE IS SUBSTANTIALLY LESS and depends on the contracts we have with different insurance companies.   Amount Billed to Insurance One allergy  vial set  CPT 95165   $ 1200     Two allergy  vial set  CPT 95165   $ 2400     Three allergy  vial set  CPT 95165   $ 3600     One injection   CPT 95115   $ 35  Two injections   CPT 95117   $ 40 RUSH (Rapid Desensitization) CPT 95180 x 8  hours $500/hour  Regarding the allergy  injections, your co-pay may or may not apply with each injection, so please confirm this with your insurance company. When you start allergy  injections, 1 or 2 sets of vials are made based on your allergies.  Not all patients can be on one set of vials. A set of vials lasts 6 months to a year depending on how quickly you can proceed with your build-up of your allergy  injections. Vials are personalized for each patient depending on their specific allergens.  How often are allergy  injection given during the build-up period?   Injections are given at least weekly during the build-up period until your maintenance dose is achieved. Per the doctor's discretion, you may have the  option of getting allergy  injections two times per week during the build-up period. However, there must be at least 48 hours between injections. The build-up period is usually completed within 6-12 months depending on your ability to schedule injections and for adjustments for reactions. When maintenance dose is reached, your injection schedule is gradually changed to every two weeks and later to every three weeks. Injections will then continue every 4 weeks. Usually, injections are continued for a total of 3-5 years.   When Will I Feel Better?  Some may experience decreased allergy  symptoms during the build-up phase. For others, it may take as long as 12 months on the maintenance dose. If there is no improvement after a year of maintenance, your allergist will discuss other treatment options with you.  If you aren't responding to allergy  shots, it may be because there is not enough dose of the allergen in your vaccine or there are missing allergens that were not identified during your allergy  testing. Other reasons could be that there are high levels of the allergen in your environment or major exposure to non-allergic triggers like tobacco smoke.  What Is the Length of Treatment?  Once the  maintenance dose is reached, allergy  shots are generally continued for three to five years. The decision to stop should be discussed with your allergist at that time. Some people may experience a permanent reduction of allergy  symptoms. Others may relapse and a longer course of allergy  shots can be considered.  What Are the Possible Reactions?  The two types of adverse reactions that can occur with allergy  shots are local and systemic. Common local reactions include very mild redness and swelling at the injection site, which can happen immediately or several hours after. Report a delayed reaction from your last injection. These include arm swelling or runny nose, watery eyes or cough that occurs within 12-24 hours after injection. A systemic reaction, which is less common, affects the entire body or a particular body system. They are usually mild and typically respond quickly to medications. Signs include increased allergy  symptoms such as sneezing, a stuffy nose or hives.   Rarely, a serious systemic reaction called anaphylaxis can develop. Symptoms include swelling in the throat, wheezing, a feeling of tightness in the chest, nausea or dizziness. Most serious systemic reactions develop within 30 minutes of allergy  shots. This is why it is strongly recommended you wait in your doctor's office for 30 minutes after your injections. Your allergist is trained to watch for reactions, and his or her staff is trained and equipped with the proper medications to identify and treat them.   Report to the nurse immediately if you experience any of the following symptoms: swelling, itching or redness of the skin, hives, watery eyes/nose, breathing difficulty, excessive sneezing, coughing, stomach pain, diarrhea, or light headedness. These symptoms may occur within 15-20 minutes after injection and may require medication.   Who Should Administer Allergy  Shots?  The preferred location for receiving shots is your  prescribing allergist's office. Injections can sometimes be given at another facility where the physician and staff are trained to recognize and treat reactions, and have received instructions by your prescribing allergist.  What if I am late for an injection?   Injection dose will be adjusted depending upon how many days or weeks you are late for your injection.   What if I am sick?   Please report any illness to the nurse before receiving injections. She may adjust your dose or postpone injections  depending on your symptoms. If you have fever, flu, sinus infection or chest congestion it is best to postpone allergy  injections until you are better. Never get an allergy  injection if your asthma is causing you problems. If your symptoms persist, seek out medical care to get your health problem under control.  What If I am or Become Pregnant:  Women that become pregnant should schedule an appointment with [facility] before receiving any further allergy  injections.   Check out this information handout from the [employer] of Asthma, Allergy , and Immunology on allergy  shots!   [email or web address]     Check out this information handout from the [employer] of Asthma, Allergy , and Immunology on penicillin allergy !   [email or web address]     Check out this information handout from the [employer] of Asthma, Allergy , and Immunology on rhinitis!   [email or web address]

## 2023-09-04 NOTE — Progress Notes (Signed)
 FOLLOW UP  Date of Service/Encounter:  09/04/23   Assessment:   Chronic rhinitis - planning for skin testing at the next visit   Adverse effect of drug (penicillin)   Pruritus  Plan/Recommendations:   1. Chronic rhinitis - Testing today showed: grasses, weeds, trees, indoor molds, outdoor molds, dust mites, cat, dog, and cockroach - Copy of test results provided.  - Avoidance measures provided. - Stop taking:  - Continue with: Singulair  (montelukast ) 10mg  daily and Benadryl (since you won't give it up!) - You can use an extra dose of the antihistamine, if needed, for breakthrough symptoms.  - Consider nasal saline rinses 1-2 times daily to remove allergens from the nasal cavities as well as help with mucous clearance (this is especially helpful to do before the nasal sprays are given) - STRONGLY consider allergy  shots as a means of long-term control. - Allergy  shots re-train and reset the immune system to ignore environmental allergens and decrease the resulting immune response to those allergens (sneezing, itchy watery eyes, runny nose, nasal congestion, etc).    - Allergy  shots improve symptoms in 75-85% of patients.   2. Chronic itching - We are going to get some additional labs to look for serious causes of itching. - You have already had a good workup from your Rheumatology Team.   3. Adverse effect of drug (penicillin) - We will schedule you for skin testing and a penicillin challenge. - This will take around 3-4 hours.  4. Return in about 4 weeks (around 10/02/2023) for PENICILLIN TESTING AND CHALLENGE. You can have the follow up appointment with Dr. Iva or a Nurse Practicioner (our Nurse Practitioners are excellent and always have Physician oversight!).   Subjective:   Brittany Hood is a 48 y.o. female presenting today for follow up of No chief complaint on file.   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  Discussed the use of AI scribe software for clinical note transcription with the patient and/or guardian, who gave verbal consent to proceed.  Brittany Hood is a 48 y.o. female presenting for skin testing. She was last seen on July 11th. We could not do testing because her insurance company does not cover testing on the same day as a New Patient visit. She has been off of all antihistamines 3 days in anticipation of the testing.   At that time, we decided to do allergy  testing. For her itching, we deicded to look for serious causes of itching with some labs.   She experiences intermittent itching of varying intensity, associated with her sensitive skin. The symptoms can worsen at different times of the year without a specific pattern. Last night, she had an itching fit but did not take Benadryl. Her current regimen for itching includes Benadryl, but she wants a more effective treatment.  She has a history of being tested for rheumatoid arthritis and lupus, with previous tests showing mixed results. However, blood was detected in her urine, which she has experienced occasionally, typically associated with urinary tract infections.  She mentions that she has been sick recently, affecting her ability to attend appointments. She clarifies that she has always informed the relevant parties within 24 hours if she could not attend an appointment.  Otherwise, there have been no changes to  her past medical history, surgical history, family history, or social history.    Review of systems otherwise negative other than that mentioned in the HPI.    Objective:   There were no vitals taken for this visit. There is no height or weight on file to calculate BMI.    Physical exam deferred since this was a skin testing  appointment only.   Diagnostic studies:   Allergy  Studies:     Airborne Adult Perc - 09/04/23 0925     Time Antigen Placed 9074    Allergen Manufacturer Jestine    Location Back    Number of Test 55    Panel 1 Select    1. Control-Buffer 50% Glycerol Negative    2. Control-Histamine 2+    3. Bahia Negative    4. French Southern Territories Negative    5. Johnson 2+    6. [place]  Blue Negative    7. Meadow Fescue 2+    8. Perennial Rye 2+    9. Timothy Negative    10. Ragweed Mix Negative    11. Cocklebur 2+    12. Plantain,  English 3+    13. Baccharis 2+    14. Dog Fennel Negative    15. Russian Thistle Negative    16. Lamb's Quarters 2+    17. Sheep Sorrell 2+    18. Rough Pigweed 2+    19. Marsh Elder, Rough 2+    20. Mugwort, Common Negative    21. Box, Elder Negative    22. Cedar, red 2+    23. Sweet Gum 2+    24. Pecan Pollen 2+    25. Pine Mix 3+    26. Walnut, Black Pollen 3+    27. Red Mulberry Negative    28. Ash Mix 2+    29. Birch Mix 3+    30. Beech American Negative    31. Cottonwood, Guinea-Bissau Negative    32. Hickory, White Negative    33. Maple Mix 2+    34. Oak, Guinea-Bissau Mix 3+    35. Sycamore Eastern 3+    36. Alternaria Alternata Negative    37. Cladosporium Herbarum Negative    38. Aspergillus Mix Negative    39. Penicillium Mix 2+    40. Bipolaris Sorokiniana (Helminthosporium) 2+    41. Drechslera Spicifera (Curvularia) Negative    42. Mucor Plumbeus Negative    43. Fusarium Moniliforme 2+    44. Aureobasidium Pullulans (pullulara) Negative    45. Rhizopus Oryzae Negative    46. Botrytis Cinera 2+    47. Epicoccum Nigrum 2+    48. Phoma Betae Negative    49. Dust Mite Mix Negative    50. Cat Hair 10,000 BAU/ml 2+    51.  Dog Epithelia 3+    52. Mixed Feathers Negative    53. Horse Epithelia Negative    54. Cockroach, German Negative    55. Tobacco Leaf Negative          13 Food Perc - 09/04/23 0926       Test Information   Allergen  Manufacturer Jestine    Location Back    Number of allergen test 13    Food Select      Food   1. Peanut Negative    2. Soybean Negative    3. Wheat Negative    4. Sesame Negative    5. Milk, Cow Negative    6. Casein Negative    7. Egg  White, Chicken Negative    8. Shellfish Mix Negative    9. Fish Mix Negative    10. Cashew Negative    11. Walnut Food Negative    12. Almond Negative    13. Hazelnut Negative          Intradermal - 09/04/23 1014     Time Antigen Placed 1015    Allergen Manufacturer Greer    Location Arm    Number of Test 7    Control Negative    Bahia Negative    French Southern Territories Negative    Ragweed Mix Negative    Mold 1 Negative    Mite Mix 4+    Cockroach 3+          Allergy  testing results were read and interpreted by myself, documented by clinical staff.      Marty Shaggy, MD  [facility] 

## 2023-09-05 DIAGNOSIS — J209 Acute bronchitis, unspecified: Secondary | ICD-10-CM | POA: Diagnosis not present

## 2023-09-06 DIAGNOSIS — L299 Pruritus, unspecified: Secondary | ICD-10-CM | POA: Diagnosis not present

## 2023-09-07 LAB — ALPHA-GAL PANEL
Allergen Lamb IgE: <0.1 kU/L
Beef IgE: <0.1 kU/L
IgE (Immunoglobulin E), Serum: 287 [IU]/mL (ref 6–495)
O215-IgE Alpha-Gal: <0.1 kU/L
Pork IgE: 0.27 kU/L — AB

## 2023-09-07 LAB — PROTEIN ELECTROPHORESIS, SERUM, WITH REFLEX
A/G Ratio: 1 (ref 0.7–1.7)
Albumin ELP: 3.7 g/dL (ref 2.9–4.4)
Alpha 1: 0.2 g/dL (ref 0.0–0.4)
Alpha 2: 0.6 g/dL (ref 0.4–1.0)
Beta: 1 g/dL (ref 0.7–1.3)
Gamma Globulin: 1.9 g/dL — ABNORMAL HIGH (ref 0.4–1.8)
Globulin, Total: 3.7 g/dL (ref 2.2–3.9)
Total Protein: 7.4 g/dL (ref 6.0–8.5)

## 2023-09-08 LAB — UPEP/TP, 24-HR URINE

## 2023-09-10 LAB — UPEP/TP, 24-HR URINE
Albumin, U: 29.1 %
Alpha 1, Urine: 2.4
Alpha 2, Urine: 11.9 %
Beta, Urine: 32.8
Gamma Globulin, Urine: 23.8
Protein, 24H Urine: 182 mg/(24.h) — AB (ref 30–150)
Protein, Ur: 15.2 mg/dL

## 2023-09-13 ENCOUNTER — Ambulatory Visit: Payer: Self-pay | Admitting: Allergy & Immunology

## 2023-09-20 NOTE — Telephone Encounter (Signed)
 PT called re: missed phone call , advised of note and she thanked

## 2023-10-10 ENCOUNTER — Encounter: Admitting: Orthopedic Surgery

## 2023-10-11 ENCOUNTER — Encounter: Admitting: Allergy & Immunology

## 2023-10-23 ENCOUNTER — Encounter: Admitting: Orthopedic Surgery

## 2023-10-24 ENCOUNTER — Encounter: Admitting: Orthopedic Surgery

## 2023-11-04 ENCOUNTER — Encounter: Admitting: Orthopedic Surgery

## 2023-11-29 ENCOUNTER — Ambulatory Visit: Admitting: Family Medicine

## 2023-12-02 ENCOUNTER — Encounter: Payer: Self-pay | Admitting: Radiology

## 2024-01-16 ENCOUNTER — Ambulatory Visit: Admitting: Family Medicine

## 2024-01-16 VITALS — BP 134/82 | HR 82 | Temp 98.7°F | Ht 62.0 in | Wt 187.0 lb

## 2024-01-16 DIAGNOSIS — R519 Headache, unspecified: Secondary | ICD-10-CM | POA: Diagnosis not present

## 2024-01-16 DIAGNOSIS — Z6834 Body mass index (BMI) 34.0-34.9, adult: Secondary | ICD-10-CM

## 2024-01-16 DIAGNOSIS — H5500 Unspecified nystagmus: Secondary | ICD-10-CM | POA: Diagnosis not present

## 2024-01-16 DIAGNOSIS — G8929 Other chronic pain: Secondary | ICD-10-CM

## 2024-01-16 DIAGNOSIS — Z7689 Persons encountering health services in other specified circumstances: Secondary | ICD-10-CM | POA: Diagnosis not present

## 2024-01-16 DIAGNOSIS — Z1211 Encounter for screening for malignant neoplasm of colon: Secondary | ICD-10-CM

## 2024-01-16 DIAGNOSIS — E66811 Obesity, class 1: Secondary | ICD-10-CM | POA: Diagnosis not present

## 2024-01-16 DIAGNOSIS — E6609 Other obesity due to excess calories: Secondary | ICD-10-CM | POA: Diagnosis not present

## 2024-01-16 DIAGNOSIS — Z1231 Encounter for screening mammogram for malignant neoplasm of breast: Secondary | ICD-10-CM

## 2024-01-16 DIAGNOSIS — R42 Dizziness and giddiness: Secondary | ICD-10-CM

## 2024-01-16 DIAGNOSIS — Z114 Encounter for screening for human immunodeficiency virus [HIV]: Secondary | ICD-10-CM | POA: Diagnosis not present

## 2024-01-16 LAB — CBC WITH DIFFERENTIAL/PLATELET
Basophils Absolute: 0.1 K/uL (ref 0.0–0.1)
Basophils Relative: 1.2 % (ref 0.0–3.0)
Eosinophils Absolute: 0.2 K/uL (ref 0.0–0.7)
Eosinophils Relative: 3.3 % (ref 0.0–5.0)
HCT: 35.4 % — ABNORMAL LOW (ref 36.0–46.0)
Hemoglobin: 11.5 g/dL — ABNORMAL LOW (ref 12.0–15.0)
Lymphocytes Relative: 27.8 % (ref 12.0–46.0)
Lymphs Abs: 1.9 K/uL (ref 0.7–4.0)
MCHC: 32.6 g/dL (ref 30.0–36.0)
MCV: 84.3 fl (ref 78.0–100.0)
Monocytes Absolute: 0.5 K/uL (ref 0.1–1.0)
Monocytes Relative: 7.8 % (ref 3.0–12.0)
Neutro Abs: 4.1 K/uL (ref 1.4–7.7)
Neutrophils Relative %: 59.9 % (ref 43.0–77.0)
Platelets: 438 K/uL — ABNORMAL HIGH (ref 150.0–400.0)
RBC: 4.2 Mil/uL (ref 3.87–5.11)
RDW: 13.5 % (ref 11.5–15.5)
WBC: 6.9 K/uL (ref 4.0–10.5)

## 2024-01-16 LAB — LIPID PANEL
Cholesterol: 110 mg/dL (ref 28–200)
HDL: 31.9 mg/dL — ABNORMAL LOW (ref >39.00)
LDL Cholesterol: 70 mg/dL (ref 10–99)
NonHDL: 78.01
Total CHOL/HDL Ratio: 3
Triglycerides: 41 mg/dL (ref 10.0–149.0)
VLDL: 8.2 mg/dL (ref 0.0–40.0)

## 2024-01-16 LAB — COMPREHENSIVE METABOLIC PANEL WITH GFR
ALT: 8 U/L (ref 3–35)
AST: 9 U/L (ref 5–37)
Albumin: 4.3 g/dL (ref 3.5–5.2)
Alkaline Phosphatase: 94 U/L (ref 39–117)
BUN: 9 mg/dL (ref 6–23)
CO2: 24 meq/L (ref 19–32)
Calcium: 9.1 mg/dL (ref 8.4–10.5)
Chloride: 107 meq/L (ref 96–112)
Creatinine, Ser: 0.75 mg/dL (ref 0.40–1.20)
GFR: 93.14 mL/min (ref >60.00)
Glucose, Bld: 89 mg/dL (ref 70–99)
Potassium: 4.2 meq/L (ref 3.5–5.1)
Sodium: 139 meq/L (ref 135–145)
Total Bilirubin: 0.8 mg/dL (ref 0.2–1.2)
Total Protein: 8.2 g/dL (ref 6.0–8.3)

## 2024-01-16 LAB — TSH: TSH: 0.75 u[IU]/mL (ref 0.35–5.50)

## 2024-01-16 LAB — MAGNESIUM: Magnesium: 2.2 mg/dL (ref 1.5–2.5)

## 2024-01-16 LAB — VITAMIN D 25 HYDROXY (VIT D DEFICIENCY, FRACTURES): VITD: 7.57 ng/mL — ABNORMAL LOW (ref 30.00–100.00)

## 2024-01-16 LAB — HEMOGLOBIN A1C: Hgb A1c MFr Bld: 5.8 % (ref 4.6–6.5)

## 2024-01-16 NOTE — Progress Notes (Signed)
 New Patient Office Visit  Subjective   Patient ID: Brittany Hood, female    DOB: [birth year]  Age: 48 y.o. MRN: [id number]  CC:  Chief Complaint  Patient presents with   Establish Care    HPI Brittany Hood presents to establish care with new provider.  Patients previous primary care provider: Dr. Tanda B[facility]   Specialist: [facility] with Dr. Jennifer Ozan  [facility] with Dr. Redell Ness  [facility] [place] with Dr. Marty Shaggy  [facility] with Dr. Taft Minerva   Patient is complaining of headaches. Started at the age of 63. Intermittent. She reports they are sporadic, where she may have 4-5 headaches in a month, but other times she will go months without headaches. She reports she usually has headaches on the right side of head, but if headache is severe it will be all over. Usually last days to a month. Patient will wake up with headaches if she goes to bed early. Denies nausea, vomiting, blurry vision. Reports she will get dizzy. Dizzy depends on severity of headache. She can not identify anything that improves headaches or makes it worse. She has tried Tylenol , Aleve , and Ibuprofen with no relief. She reports she has stared drinking water a month ago, usually drinks about 2 18oz bottle of water a day. Does not feel like her headaches have improved with increasing her water intake.   She has never had imaging or seen a specialist for headaches. She reports her last PCP prescribed Amitriptyline 25mg  daily at bedtime with no relief. Didn't seem to help, but only took it for a week. Not had an eye exam since 2012.   Outpatient Encounter Medications as of 01/16/2024  Medication Sig   megestrol  (MEGACE ) 40 MG tablet 24 days on 4 days off   [DISCONTINUED] amitriptyline (ELAVIL) 25 MG tablet Take 25 mg by mouth  at bedtime.   [DISCONTINUED] amoxicillin  (AMOXIL ) 250 MG/5ML suspension Take 5 mLs (250 mg total) by mouth 3 (three) times daily.   [DISCONTINUED] montelukast  (SINGULAIR ) 10 MG tablet Take 1 tablet (10 mg total) by mouth at bedtime.   No facility-administered encounter medications on file as of 01/16/2024.    Past Medical History:  Diagnosis Date   Anemia    Anxiety    Blood transfusion without reported diagnosis    Clotting disorder    Depression    Scoliosis    Sleep apnea    Vertigo     Past Surgical History:  Procedure Laterality Date   CARPAL TUNNEL RELEASE Right 04/16/2023   Procedure: CARPAL TUNNEL RELEASE;  Surgeon: Minerva Taft BRAVO, MD;  Location: [facility];  Service: Orthopedics;  Laterality: Right;   CESAREAN SECTION     x2   DIAGNOSTIC LAPAROSCOPY     TUBAL LIGATION      Family History  Problem Relation Age of Onset   Heart attack Mother    Hypertension Mother    Obesity Mother    Kidney failure Maternal Grandmother    Kidney disease Maternal Grandmother    Heart attack Maternal Grandfather    Asthma Son    Allergic rhinitis Son    ADD / ADHD Son    Intellectual disability Son     Social History   Socioeconomic History   Marital status: Married    Spouse name: Not on file   Number of children: 2  Years of education: Not on file   Highest education level: Master's degree (e.g., MA, MS, MEng, MEd, MSW, MBA)  Occupational History   Not on file  Tobacco Use   Smoking status: Never   Smokeless tobacco: Never  Vaping Use   Vaping status: Never Used  Substance and Sexual Activity   Alcohol use: Never   Drug use: Never   Sexual activity: Yes    Birth control/protection: I.U.D.  Other Topics Concern   Not on file  Social History Narrative   Not on file   Social Drivers of Health   Tobacco Use: Low Risk (01/16/2024)   Patient History    Smoking Tobacco Use: Never    Smokeless Tobacco Use: Never    Passive Exposure: Not on file  Financial  Resource Strain: Low Risk (01/16/2024)   Overall Financial Resource Strain (CARDIA)    Difficulty of Paying Living Expenses: Not hard at all  Food Insecurity: Food Insecurity Present (01/16/2024)   Epic    Worried About Programme Researcher, Broadcasting/film/video in the Last Year: Sometimes true    Ran Out of Food in the Last Year: Often true  Transportation Needs: No Transportation Needs (01/16/2024)   Epic    Lack of Transportation (Medical): No    Lack of Transportation (Non-Medical): No  Physical Activity: Inactive (01/16/2024)   Exercise Vital Sign    Days of Exercise per Week: 0 days    Minutes of Exercise per Session: 0 min  Stress: Stress Concern Present (01/16/2024)   [employer] of Occupational Health - Occupational Stress Questionnaire    Feeling of Stress: To some extent  Social Connections: Patient Declined (01/16/2024)   Social Connection and Isolation Panel    Frequency of Communication with Friends and Family: Patient declined    Frequency of Social Gatherings with Friends and Family: Patient declined    Attends Religious Services: Patient declined    Active Member of Clubs or Organizations: Patient declined    Attends Banker Meetings: Patient declined    Marital Status: Patient declined  Intimate Partner Violence: Not At Risk (01/16/2024)   Epic    Fear of Current or Ex-Partner: No    Emotionally Abused: No    Physically Abused: No    Sexually Abused: No  Depression (PHQ2-9): Low Risk (01/16/2024)   Depression (PHQ2-9)    PHQ-2 Score: 0  Alcohol Screen: Low Risk (01/16/2024)   Alcohol Screen    Last Alcohol Screening Score (AUDIT): 0  Housing: Low Risk (01/16/2024)   Epic    Unable to Pay for Housing in the Last Year: No    Number of Times Moved in the Last Year: 0    Homeless in the Last Year: No  Utilities: Not At Risk (01/16/2024)   Epic    Threatened with loss of utilities: No  Health Literacy: Adequate Health Literacy (01/16/2024)   B1300 Health  Literacy    Frequency of need for help with medical instructions: Never    ROS See HPI above    Objective  BP 134/82   Pulse 82   Temp 98.7 F (37.1 C) (Oral)   Ht 5' 2 (1.575 m)   Wt 187 lb (84.8 kg)   SpO2 99%   BMI 34.20 kg/m   Physical Exam Vitals reviewed.  Constitutional:      General: She is not in acute distress.    Appearance: Normal appearance. She is obese. She is not ill-appearing, toxic-appearing or diaphoretic.  HENT:  Head: Normocephalic and atraumatic.  Eyes:     General:        Right eye: No discharge.        Left eye: No discharge.     Extraocular Movements:     Right eye: Nystagmus (Vertical and horizontal) present.     Left eye: Nystagmus (Vertical and horizontal) present.     Conjunctiva/sclera: Conjunctivae normal.  Cardiovascular:     Rate and Rhythm: Normal rate and regular rhythm.     Heart sounds: Normal heart sounds. No murmur heard.    No friction rub. No gallop.  Pulmonary:     Effort: Pulmonary effort is normal. No respiratory distress.     Breath sounds: Normal breath sounds.  Musculoskeletal:        General: Normal range of motion.  Skin:    General: Skin is warm and dry.  Neurological:     General: No focal deficit present.     Mental Status: She is alert and oriented to person, place, and time. Mental status is at baseline.     Cranial Nerves: Cranial nerves 2-12 are intact. No facial asymmetry.     Sensory: Sensation is intact.     Coordination: Finger-Nose-Finger Test normal.     Gait: Gait is intact.  Psychiatric:        Mood and Affect: Mood normal.        Behavior: Behavior normal.        Thought Content: Thought content normal.        Judgment: Judgment normal.      Assessment & Plan:  Encounter to establish care  Colon cancer screening -     Ambulatory referral to Gastroenterology  Encounter for screening mammogram for malignant neoplasm of breast -     3D Screening Mammogram, Left and Right;  Future  Chronic nonintractable headache, unspecified headache type -     CBC with Differential/Platelet -     Comprehensive metabolic panel with GFR -     Magnesium -     TSH -     VITAMIN D  25 Hydroxy (Vit-D Deficiency, Fractures) -     CT HEAD WO CONTRAST ( ); Future  Class 1 obesity due to excess calories without serious comorbidity with body mass index (BMI) of 34.0 to 34.9 in adult -     CBC with Differential/Platelet -     Comprehensive metabolic panel with GFR -     Hemoglobin A1c -     TSH -     VITAMIN D  25 Hydroxy (Vit-D Deficiency, Fractures) -     Lipid panel  Nystagmus -     CT HEAD WO CONTRAST ( ); Future  Dizziness -     CT HEAD WO CONTRAST ( ); Future  Encounter for screening for HIV -     HIV Antibody (routine testing w rflx)  1.Review health maintenance: -Covid booster: Declines  -Tdap vaccine: May obtain at local pharmacy  -HIV screening: Ordered  -PNA vaccine: Declines  -Hep B vaccine: Probably had them previously. May obtain at local pharmacy  -Colonoscopy: Placed a referral to GI  -Influenza vaccine: Declines  -Mammogram: Ordered  2.Ordered labs based on BMI and complaints of headaches. Office will call with lab results and will be available via MyChart.  3.Ordered CT head scan without contrast for headache with nystagmus and dizziness. Briefly discussed about retrying Amitriptyline but at a higher dose and longer period. Will obtain labs results first and work on CT headscan.  4.Recommend  to obtain an eye exam by ophthalmology.  5.Recommend to increase water intake to 64-100oz of water a day.   Return in about 3 weeks (around 02/06/2024) for follow-up.   Madoline Bhatt, NP

## 2024-01-16 NOTE — Patient Instructions (Addendum)
-  It was nice to meet you and look forward to taking care of you. -Placed a referral to gastroenterology for colon cancer screening. Please call the office or send a MyChart message if you do not receive a phone call or a MyChart message about appointment in 2 weeks.  -Ordered mammogram. Please call the office or send a MyChart message if you do not receive a phone call or a MyChart message about appointment in 2 weeks.  -Ordered labs based on BMI and complaints of headaches. Office will call with lab results and will be available via MyChart.  -Ordered CT head scan without contrast for headache. Please call the office or send a MyChart message if you do not receive a phone call or a MyChart message about appointment in 2 weeks.  -Recommend to obtain an eye exam by ophthalmology.  -Recommend to increase water intake to 64-100oz of water a day. -Follow up in 3 weeks.

## 2024-01-17 ENCOUNTER — Ambulatory Visit: Payer: Self-pay | Admitting: Family Medicine

## 2024-01-17 ENCOUNTER — Ambulatory Visit

## 2024-01-17 DIAGNOSIS — D649 Anemia, unspecified: Secondary | ICD-10-CM | POA: Diagnosis not present

## 2024-01-17 LAB — FERRITIN: Ferritin: 39.4 ng/mL (ref 10.0–291.0)

## 2024-01-17 LAB — IRON: Iron: 74 ug/dL (ref 42–145)

## 2024-01-18 LAB — HIV ANTIBODY (ROUTINE TESTING W REFLEX)
HIV 1&2 Ab, 4th Generation: NONREACTIVE
HIV FINAL INTERPRETATION: NEGATIVE

## 2024-01-29 ENCOUNTER — Ambulatory Visit
Admission: RE | Admit: 2024-01-29 | Discharge: 2024-01-29 | Disposition: A | Discharge status: Home / Self Care | Source: Ambulatory Visit | Attending: Family Medicine | Admitting: Family Medicine

## 2024-01-29 DIAGNOSIS — H5500 Unspecified nystagmus: Secondary | ICD-10-CM

## 2024-01-29 DIAGNOSIS — R519 Headache, unspecified: Secondary | ICD-10-CM

## 2024-01-29 DIAGNOSIS — R42 Dizziness and giddiness: Secondary | ICD-10-CM

## 2024-02-04 ENCOUNTER — Ambulatory Visit: Admitting: Family Medicine

## 2024-02-06 ENCOUNTER — Ambulatory Visit: Admitting: Family Medicine

## 2024-02-11 ENCOUNTER — Ambulatory Visit: Admitting: Family Medicine

## 2024-02-11 ENCOUNTER — Encounter: Payer: Self-pay | Admitting: Family Medicine

## 2024-02-11 VITALS — BP 124/78 | HR 75 | Temp 98.3°F | Ht 62.0 in | Wt 193.0 lb

## 2024-02-11 DIAGNOSIS — G43919 Migraine, unspecified, intractable, without status migrainosus: Secondary | ICD-10-CM | POA: Diagnosis not present

## 2024-02-11 DIAGNOSIS — R7989 Other specified abnormal findings of blood chemistry: Secondary | ICD-10-CM

## 2024-02-11 MED ORDER — AMITRIPTYLINE HCL 25 MG PO TABS: ORAL_TABLET | ORAL | 0 refills | Status: AC

## 2024-02-11 MED ORDER — VITAMIN D (ERGOCALCIFEROL) 1.25 MG (50000 UNIT) PO CAPS: 50000.0000 [IU] | ORAL_CAPSULE | ORAL | 0 refills | Status: AC

## 2024-02-11 NOTE — Assessment & Plan Note (Signed)
 CT on 01/29/24 showed no abnormalities. -Ordered Amitriptyline  25- 50 mg tablets. Take 1 (25 mg) tablet once a night for 1 week. Then the next week, increase to 2 tablets once a night. -Potential Vit D supplementation can help

## 2024-02-11 NOTE — Patient Instructions (Addendum)
 It was nice to see you today. Today we: -Assessed and discussed probable causes of headache.  -Reviewed CT imaging results of 01/29/24. -Please schedule an eye exam when available. -Ordered Amitriptyline  25- 50 mg tablets. Take 1 (25 mg) tablet once a night for 1 week. Then the next week, increase to 2 tablets once a night. -Potential Vit D supplementation can help. Recommend Vitamin D3 50,000IU tablet every 7 days for 12 weeks. Recheck vitamin D  in 3 months.  -Please reach out to the provider if prescriptions are difficult to obtain. Follow up in 1 month.

## 2024-02-11 NOTE — Progress Notes (Unsigned)
 "  Established Patient Office Visit   Subjective:  Patient ID: Brittany Hood, female    DOB: [birth year]  Age: 49 y.o. MRN: [id number]  Chief Complaint  Patient presents with   Medical Management of Chronic Issues    3 week follow up    HPI Brittany Hood presents to the clinic for a chief complaint of ongoing management of chronic re-occurring headaches.   Reports that this morning woke up with a headache on her left side. Described as moderate to severe. Currently still having symptoms. Denies that it radiates. Feels like someone has hit her head. Reports sleeping on her right side. Denies any ocular pain. Denies any dizziness, blurry vision, nausea, vomiting. Reports that she has increased her water intake- about 60oz per day. he has tried Tylenol , Aleve , and Ibuprofen with no relief. Prior PCP had tried Amitriptyline  25mg  daily at bedtime with no relief. Didn't seem to help, but only took it for a week.  -CT Head Without contrast on 01/29/24 shows no abnormalities and a normal impression. -Has not gotten to an eye exam yet (due since 2013).  Review of Systems  Constitutional: Negative.   HENT:  Negative for ear pain, hearing loss and sinus pain.   Eyes:  Negative for blurred vision, double vision, photophobia and pain.  Respiratory: Negative.    Cardiovascular: Negative.   Gastrointestinal: Negative.  Negative for nausea and vomiting.  Genitourinary: Negative.   Musculoskeletal: Negative.   Skin: Negative.   Neurological: Negative.   Psychiatric/Behavioral: Negative.       Objective:    BP 124/78   Pulse 75   Temp 98.3 F (36.8 C) (Oral)   Ht 5' 2 (1.575 m)   Wt 87.5 kg   SpO2 98%   BMI 35.30 kg/m  {Vitals History (Optional):23777}  Physical Exam Constitutional:      Appearance: Normal appearance. She is obese.  HENT:     Head: Normocephalic.     Right Ear: Tympanic membrane, ear canal and external ear normal.     Left Ear: Tympanic membrane, ear canal and external  ear normal.  Eyes:     Extraocular Movements: Extraocular movements intact.     Conjunctiva/sclera: Conjunctivae normal.     Pupils: Pupils are equal, round, and reactive to light.  Cardiovascular:     Rate and Rhythm: Normal rate and regular rhythm.     Heart sounds: Normal heart sounds.  Pulmonary:     Breath sounds: Normal breath sounds.  Musculoskeletal:     Cervical back: Normal range of motion.  Skin:    General: Skin is warm and dry.  Neurological:     General: No focal deficit present.     Mental Status: She is alert and oriented to person, place, and time. Mental status is at baseline.  Psychiatric:        Mood and Affect: Mood normal.        Behavior: Behavior normal.        Thought Content: Thought content normal.        Judgment: Judgment normal.     Assessment & Plan:  Intractable migraine without status migrainosus, unspecified migraine type Assessment & Plan: CT on 01/29/24 showed no abnormalities. -Ordered Amitriptyline  25- 50 mg tablets. Take 1 (25 mg) tablet once a night for 1 week. Then the next week, increase to 2 tablets once a night. -Potential Vit D supplementation can help   Low vitamin D  level   -Assessed and discussed probable causes of headache.  -  Reviewed CT imaging results of 01/29/24. -Please schedule an eye exam when available. -Ordered Amitriptyline  25- 50 mg tablets. Take 1 (25 mg) tablet once a night for 1 week. Then the next week, increase to 2 tablets once a night. -Potential Vit D supplementation can help. Recommend Vitamin D3 50,000IU tablet every 7 days for 12 weeks. Recheck vitamin D  in 3 months.  -Please reach out to the provider if prescriptions are difficult to obtain. Follow up in 1 month.  Morna Kipper, RN  "

## 2024-02-12 MED ORDER — VITAMIN D3 1.25 MG (50000 UT) PO CAPS: 1.2500 mg | ORAL_CAPSULE | ORAL | 0 refills | Status: AC

## 2024-02-19 ENCOUNTER — Encounter (INDEPENDENT_AMBULATORY_CARE_PROVIDER_SITE_OTHER): Payer: Self-pay | Admitting: *Deleted

## 2024-02-27 ENCOUNTER — Ambulatory Visit: Admitting: Obstetrics & Gynecology

## 2024-03-13 ENCOUNTER — Ambulatory Visit: Admitting: Family Medicine

## 2024-03-24 ENCOUNTER — Ambulatory Visit: Admitting: Obstetrics & Gynecology
